# Patient Record
Sex: Female | Born: 1970 | Race: Black or African American | Hispanic: No | Marital: Married | Smoking: Current every day smoker
Health system: Southern US, Community
[De-identification: ages and names within clinical notes are randomized; demographics above are authoritative.]

## PROBLEM LIST (undated history)

## (undated) DIAGNOSIS — F319 Bipolar disorder, unspecified: Secondary | ICD-10-CM

## (undated) DIAGNOSIS — F329 Major depressive disorder, single episode, unspecified: Secondary | ICD-10-CM

## (undated) DIAGNOSIS — E7800 Pure hypercholesterolemia, unspecified: Secondary | ICD-10-CM

## (undated) DIAGNOSIS — F32A Depression, unspecified: Secondary | ICD-10-CM

## (undated) DIAGNOSIS — D735 Infarction of spleen: Secondary | ICD-10-CM

## (undated) DIAGNOSIS — I10 Essential (primary) hypertension: Secondary | ICD-10-CM

## (undated) HISTORY — PX: ABDOMINAL HYSTERECTOMY: SHX81

## (undated) HISTORY — PX: OTHER SURGICAL HISTORY: SHX169

## (undated) HISTORY — PX: TUBAL LIGATION: SHX77

## (undated) HISTORY — PX: PALATOPLASTY: SHX2153

---

## 1997-11-14 ENCOUNTER — Encounter: Admission: RE | Admit: 1997-11-14 | Discharge: 1997-11-14 | Payer: Self-pay | Admitting: *Deleted

## 2000-04-26 ENCOUNTER — Emergency Department (HOSPITAL_COMMUNITY): Admission: EM | Admit: 2000-04-26 | Discharge: 2000-04-26 | Payer: Self-pay | Admitting: Emergency Medicine

## 2001-01-04 ENCOUNTER — Encounter: Payer: Self-pay | Admitting: Emergency Medicine

## 2001-01-04 ENCOUNTER — Emergency Department (HOSPITAL_COMMUNITY): Admission: EM | Admit: 2001-01-04 | Discharge: 2001-01-04 | Payer: Self-pay

## 2001-01-28 ENCOUNTER — Emergency Department (HOSPITAL_COMMUNITY): Admission: EM | Admit: 2001-01-28 | Discharge: 2001-01-28 | Payer: Self-pay

## 2001-10-31 ENCOUNTER — Emergency Department (HOSPITAL_COMMUNITY): Admission: EM | Admit: 2001-10-31 | Discharge: 2001-10-31 | Payer: Self-pay | Admitting: Emergency Medicine

## 2002-06-16 ENCOUNTER — Ambulatory Visit (HOSPITAL_COMMUNITY): Admission: RE | Admit: 2002-06-16 | Discharge: 2002-06-16 | Payer: Self-pay | Admitting: Emergency Medicine

## 2002-08-25 ENCOUNTER — Emergency Department (HOSPITAL_COMMUNITY): Admission: EM | Admit: 2002-08-25 | Discharge: 2002-08-25 | Payer: Self-pay | Admitting: Emergency Medicine

## 2002-11-26 ENCOUNTER — Emergency Department (HOSPITAL_COMMUNITY): Admission: EM | Admit: 2002-11-26 | Discharge: 2002-11-26 | Payer: Self-pay | Admitting: Emergency Medicine

## 2002-11-26 ENCOUNTER — Encounter: Payer: Self-pay | Admitting: *Deleted

## 2002-12-11 ENCOUNTER — Emergency Department (HOSPITAL_COMMUNITY): Admission: AD | Admit: 2002-12-11 | Discharge: 2002-12-11 | Payer: Self-pay | Admitting: Emergency Medicine

## 2003-02-02 ENCOUNTER — Encounter: Payer: Self-pay | Admitting: Cardiovascular Disease

## 2003-02-02 ENCOUNTER — Ambulatory Visit (HOSPITAL_COMMUNITY): Admission: RE | Admit: 2003-02-02 | Discharge: 2003-02-02 | Payer: Self-pay | Admitting: Cardiovascular Disease

## 2003-05-02 ENCOUNTER — Emergency Department (HOSPITAL_COMMUNITY): Admission: EM | Admit: 2003-05-02 | Discharge: 2003-05-02 | Payer: Self-pay | Admitting: Emergency Medicine

## 2003-12-31 ENCOUNTER — Emergency Department (HOSPITAL_COMMUNITY): Admission: EM | Admit: 2003-12-31 | Discharge: 2003-12-31 | Payer: Self-pay | Admitting: Emergency Medicine

## 2004-12-03 ENCOUNTER — Other Ambulatory Visit: Admission: RE | Admit: 2004-12-03 | Discharge: 2004-12-03 | Payer: Self-pay | Admitting: Obstetrics and Gynecology

## 2005-03-11 ENCOUNTER — Observation Stay (HOSPITAL_COMMUNITY): Admission: RE | Admit: 2005-03-11 | Discharge: 2005-03-12 | Payer: Self-pay | Admitting: Obstetrics and Gynecology

## 2005-03-11 ENCOUNTER — Encounter (INDEPENDENT_AMBULATORY_CARE_PROVIDER_SITE_OTHER): Payer: Self-pay | Admitting: Specialist

## 2006-07-01 ENCOUNTER — Ambulatory Visit (HOSPITAL_COMMUNITY): Admission: RE | Admit: 2006-07-01 | Discharge: 2006-07-01 | Payer: Self-pay | Admitting: Obstetrics and Gynecology

## 2006-08-26 ENCOUNTER — Inpatient Hospital Stay (HOSPITAL_COMMUNITY): Admission: RE | Admit: 2006-08-26 | Discharge: 2006-08-27 | Payer: Self-pay | Admitting: Obstetrics and Gynecology

## 2006-08-26 ENCOUNTER — Encounter (INDEPENDENT_AMBULATORY_CARE_PROVIDER_SITE_OTHER): Payer: Self-pay | Admitting: Specialist

## 2006-10-24 ENCOUNTER — Emergency Department (HOSPITAL_COMMUNITY): Admission: EM | Admit: 2006-10-24 | Discharge: 2006-10-24 | Payer: Self-pay | Admitting: Emergency Medicine

## 2007-11-28 ENCOUNTER — Emergency Department (HOSPITAL_COMMUNITY): Admission: EM | Admit: 2007-11-28 | Discharge: 2007-11-28 | Payer: Self-pay | Admitting: Emergency Medicine

## 2009-05-06 ENCOUNTER — Ambulatory Visit: Payer: Self-pay | Admitting: Oncology

## 2009-05-14 LAB — CMP (CANCER CENTER ONLY)
AST: 30 U/L (ref 11–38)
Albumin: 4 g/dL (ref 3.3–5.5)
Alkaline Phosphatase: 70 U/L (ref 26–84)
BUN, Bld: 11 mg/dL (ref 7–22)
Calcium: 9.5 mg/dL (ref 8.0–10.3)
Chloride: 100 mEq/L (ref 98–108)
Glucose, Bld: 92 mg/dL (ref 73–118)
Potassium: 3.3 mEq/L (ref 3.3–4.7)
Sodium: 136 mEq/L (ref 128–145)
Total Protein: 7.8 g/dL (ref 6.4–8.1)

## 2009-05-14 LAB — CBC WITH DIFFERENTIAL (CANCER CENTER ONLY)
BASO#: 0.1 10*3/uL (ref 0.0–0.2)
BASO%: 0.8 % (ref 0.0–2.0)
EOS%: 5.9 % (ref 0.0–7.0)
Eosinophils Absolute: 0.4 10*3/uL (ref 0.0–0.5)
HCT: 36.6 % (ref 34.8–46.6)
HGB: 12.5 g/dL (ref 11.6–15.9)
LYMPH#: 3 10*3/uL (ref 0.9–3.3)
LYMPH%: 46.5 % (ref 14.0–48.0)
MCH: 36.6 pg — ABNORMAL HIGH (ref 26.0–34.0)
MCHC: 34.2 g/dL (ref 32.0–36.0)
MCV: 107 fL — ABNORMAL HIGH (ref 81–101)
MONO#: 0.4 10*3/uL (ref 0.1–0.9)
MONO%: 5.6 % (ref 0.0–13.0)
NEUT#: 2.7 10*3/uL (ref 1.5–6.5)
NEUT%: 41.2 % (ref 39.6–80.0)
Platelets: 321 10*3/uL (ref 145–400)
RBC: 3.41 10*6/uL — ABNORMAL LOW (ref 3.70–5.32)
RDW: 11 % (ref 10.5–14.6)
WBC: 6.5 10*3/uL (ref 3.9–10.0)

## 2009-05-18 LAB — PROTEIN ELECTROPHORESIS, SERUM
Alpha-2-Globulin: 9.1 % (ref 7.1–11.8)
Beta Globulin: 5 % (ref 4.7–7.2)
Gamma Globulin: 17.4 % (ref 11.1–18.8)
Total Protein, Serum Electrophoresis: 7.9 g/dL (ref 6.0–8.3)

## 2009-05-18 LAB — HYPERCOAGULABLE PANEL, COMPREHENSIVE
Beta-2 Glyco I IgG: <3 U/mL (ref <=15)
DRVVT 1:1 Mix: 42.3 secs (ref 36.1–47.0)
DRVVT: 46.3 secs — ABNORMAL HIGH (ref 36.2–46.0)
Homocysteine: 48.8 umol/L — ABNORMAL HIGH (ref 4.0–15.4)
PTT Lupus Anticoagulant: 42.5 secs (ref 32.0–43.4)
Protein C Activity: 181 % — ABNORMAL HIGH (ref 75–133)
Protein C, Total: 130 % (ref 70–140)
Protein S Activity: 151 % — ABNORMAL HIGH (ref 69–129)
Protein S Ag, Total: 151 % — ABNORMAL HIGH (ref 70–140)

## 2009-05-18 LAB — FOLATE: Folate: 5 ng/mL

## 2009-06-07 ENCOUNTER — Ambulatory Visit: Payer: Self-pay | Admitting: Oncology

## 2009-07-16 ENCOUNTER — Ambulatory Visit: Payer: Self-pay | Admitting: Oncology

## 2009-10-07 ENCOUNTER — Ambulatory Visit: Payer: Self-pay | Admitting: Oncology

## 2010-03-11 ENCOUNTER — Encounter
Admission: RE | Admit: 2010-03-11 | Discharge: 2010-04-15 | Payer: Self-pay | Source: Home / Self Care | Attending: Family Medicine | Admitting: Family Medicine

## 2010-04-15 ENCOUNTER — Encounter
Admission: RE | Admit: 2010-04-15 | Discharge: 2010-05-20 | Payer: Self-pay | Source: Home / Self Care | Attending: Family Medicine | Admitting: Family Medicine

## 2010-05-11 ENCOUNTER — Encounter: Payer: Self-pay | Admitting: Obstetrics and Gynecology

## 2010-09-05 NOTE — Op Note (Signed)
NAMEBELLA, BRUMMET             ACCOUNT NO.:  [id number]   MEDICAL RECORD NO.:  [id number]          PATIENT TYPE:  AMB   LOCATION:  SDC                           FACILITY:  [facility]   PHYSICIAN:  Miguel Aschoff, M.D.       DATE OF BIRTH:  02-01-1971   DATE OF PROCEDURE:  08/26/2006  DATE OF DISCHARGE:                               OPERATIVE REPORT   PREOPERATIVE DIAGNOSIS:  Menorrhagia, chronic pelvic pain.   POSTOPERATIVE DIAGNOSIS:  Menorrhagia, chronic pelvic pain, with pelvic  adhesions.   PROCEDURE:  Laparoscopically assisted vaginal hysterectomy and right  salpingo-oophorectomy, lysis of adhesions.   SURGEON:  Miguel Aschoff, M.D.   ASSISTANT:  Luvenia Redden, M.D.   ANESTHESIA:  General.   COMPLICATIONS:  None.   JUSTIFICATION:  The patient is a 39 year old black female with history  of persistent chronic pelvic pain, previous history of pelvic adhesions,  previous history of left salpingo-oophorectomy because of pelvic pain  and pelvic adhesions. Her symptoms have now returned.  In addition to  the pelvic pain, she is having menorrhagia.  The options of therapy were  discussed with the patient and, at this point, the patient wanted a  definitive procedure carried out for resolution of her pelvic pain and  she presents now to undergo laparoscopically assisted vaginal  hysterectomy and right salpingo-oophorectomy.  The risks and benefits of  the procedure were discussed with the patient.  She understands that  this will result in her inability to have children and, in addition to  that, it will necessitate her requiring replacement therapy. With this,  information informed consent has been obtained.   DESCRIPTION OF PROCEDURE:  The patient was taken to the operating room  and placed in the supine position.  General anesthesia was administered  without difficulty.  She was then placed in dorsal lithotomy position,  prepped and draped in the usual sterile fashion.  The bladder  was  catheterized.  Once this was done, a Hulka tenaculum was placed through  the cervix and held.  Attention was then directed to the umbilicus where  a small infraumbilical incision was made.  A Veress needle was inserted  and then the abdomen was insufflated with 3 liters of CO2.  Following  the insufflation, the trocar to the laparoscope was placed followed by  laparoscope itself.  After this was done, systematic inspection was  carried out.  There were adhesions holding the omentum to the right  anterior abdominal wall.  The adhesions previously noted on her  laparoscopy in 2006 had not reformed.  The uterus appeared to be normal  size and shape.  The right ovary appeared to be within normal limits.  There was no definite evidence of endometriosis.   At this point, under direct vision visualization, accessory ports were  established in the left lower quadrant and right lower quadrant.  Through these 5 mm ports it was possible to place the gyrus unit. On the  left side, the round ligament was identified, cauterized and cut.  The  structures of the broad ligament were grasped, cauterized and cut until  the level of the uterine artery.  Attention was then directed to the  right side where the infundibulopelvic ligament was identified, grasped  and elevated, and with the ureter being out of the way, the  infundibulopelvic ligament was cauterized and cut and then the  dissection was carried medially towards the uterus by cauterizing the  mesosalpinx and meso-ovarian ligaments until the uterus was reached.  Then, the round ligaments were identified, cauterized and cut, and  again, the broad ligament structures were cauterized and cut to the  level of the uterine arteries.   At this point, attention was directed to the perineum.  The patient was  placed in the high lithotomy position.  A weighted speculum was placed  in the vaginal vault.  The anterior cervical lip was then grasped with  a  tenaculum and injected with 10 mL 1% Xylocaine with epinephrine.  At  this point, the cervical mucosa was circumscribed and dissected  anteriorly and posteriorly until the peritoneal reflections were  identified.  The peritoneum was entered posteriorly and then the  uterosacral ligaments were identified, clamped with curved Heaney  clamps, pedicles were cut and suture ligated using suture ligatures of 0  Vicryl.  The cardinal ligaments were then clamped, cut, and suture  ligated in a similar fashion. Then, the paracervical fascia was  identified, clamped, cut and suture ligated using suture ligatures of 0  Vicryl.  This continued until the uterine vessels were found, clamped,  cut and suture ligated bilaterally.  It was then possible to deliver the  fundus of the uterus to the cul-de-sac.  The residual structures of the  broad ligament were then clamped, cut, and this freed the specimen, the  specimen consisting of the cervix, uterus, right tube and ovary.  These  were all sent for histologic study.  These pedicles were then tied with  ligatures of the 0 Vicryl.   At this point, the posterior vaginal cuff was run using running  interlocking 0 Vicryl suture.  The uterosacral ligaments were then  reapproximated in the midline using interrupted 0 Vicryl sutures and, at  this point, the cuff was closed using running interlocking 0 Vicryl  suture.  An Iodoform pack was placed in the vagina and, at this point,  the patient was placed in the modified lithotomy position.  Attention  was then directed to the laparoscopy ports.  The abdomen was  reinsufflated and inspection of the pelvis for hemostasis revealed  excellent hemostasis. At this point, the CO2 was allowed to escape.  The  trocars were removed and the small incisions were closed using  subcuticular 3-0 Vicryl. Band-Aids were applied.  At this point, the  patient was reversed from the anesthetic and taken to the recovery room in  satisfactory condition.  The estimated blood loss was approximately  100 mL.  The patient tolerated the procedure well.      Miguel Aschoff, M.D.  Electronically Signed     AR/MEDQ  D:  08/26/2006  T:  08/26/2006  Job:  [id number]

## 2010-09-05 NOTE — Discharge Summary (Signed)
Miranda Bernard, Miranda Bernard             ACCOUNT NO.:  [id number]   MEDICAL RECORD NO.:  [id number]          PATIENT TYPE:  INP   LOCATION:  9315                          FACILITY:  [facility]   PHYSICIAN:  Miguel Aschoff, M.D.       DATE OF BIRTH:  Nov 09, 1970   DATE OF ADMISSION:  08/26/2006  DATE OF DISCHARGE:  08/27/2006                               DISCHARGE SUMMARY   ADMISSION DIAGNOSES:  1. Chronic pelvic pain.  2. Menorrhagia.   FINAL DIAGNOSIS:  1. Adenomyosis.  2. Focal endometriosis.  3. Pelvic and abdominal adhesions.   OPERATIONS AND PROCEDURES:  Laparoscopically-assisted total vaginal  hysterectomy and right salpingo-oophorectomy.   BRIEF HISTORY:  The patient is a 40 year old black female who has had a  history of progressively worsening menorrhagia associated with pelvic  pain.  Attempts had been made to control the patient's bleeding and pain  using conservative measures.  However, these did not lead to any  improvement.  The patient did have a history of pelvic adhesions and did  undergo a laparoscopic left salpingo-oophorectomy in 2006, but because  of renewed pain and again heavy bleeding, she has requested that a  definitive procedure be carried out in an effort to resolve this  problem.  She was informed of the risks and limitations of this  procedure.  She understood the hysterectomy would result in her  inability to have children and that bilateral oophorectomy would require  hormone replacement therapy, but with this information informed consent  was obtained.   HOSPITAL COURSE:  Preoperative studies were obtained and on Aug 26, 2006,  under general anesthesia, laparoscopically-assisted total vaginal  hysterectomy and right salpingo-oophorectomy were carried out without  difficulty.  The patient's postoperative course was essentially  uncomplicated.  She did tolerate increasing ambulation and diet well and  by the afternoon of the first postoperative day, the patient  was feeling  well enough to be discharged home.  Medications for home included Tylox  one every three hours as needed for pain.  The patient was instructed to  resume all her other medications taken prior to admission to the  hospital she was instructed to do no heavy lifting, place nothing in  vagina and to call for any problems such as fever, pain or heavy  bleeding.  The final pathology report on the hysterectomy specimen did  reveal the presence of the adenomyosis and focal endometriosis.  Inspection of the adnexa revealed fibrous adhesions, benign follicular  cyst.      Miguel Aschoff, M.D.  Electronically Signed     AR/MEDQ  D:  08/31/2006  T:  08/31/2006  Job:  [id number]

## 2011-02-03 LAB — URINALYSIS, ROUTINE W REFLEX MICROSCOPIC
Bilirubin Urine: NEGATIVE
Hgb urine dipstick: NEGATIVE
Ketones, ur: NEGATIVE
Nitrite: POSITIVE — AB
Protein, ur: NEGATIVE
Urobilinogen, UA: 1

## 2011-02-03 LAB — URINE CULTURE: Colony Count: >=100000

## 2011-02-03 LAB — URINE MICROSCOPIC-ADD ON

## 2014-04-06 ENCOUNTER — Encounter (HOSPITAL_COMMUNITY): Payer: Self-pay | Admitting: Emergency Medicine

## 2014-04-06 ENCOUNTER — Emergency Department (HOSPITAL_COMMUNITY)
Admission: EM | Admit: 2014-04-06 | Discharge: 2014-04-06 | Disposition: A | Discharge status: Home / Self Care | Payer: BC Managed Care – PPO | Attending: Emergency Medicine | Admitting: Emergency Medicine

## 2014-04-06 DIAGNOSIS — Z720 Tobacco use: Secondary | ICD-10-CM | POA: Insufficient documentation

## 2014-04-06 DIAGNOSIS — K029 Dental caries, unspecified: Secondary | ICD-10-CM | POA: Insufficient documentation

## 2014-04-06 DIAGNOSIS — Z8659 Personal history of other mental and behavioral disorders: Secondary | ICD-10-CM | POA: Diagnosis not present

## 2014-04-06 DIAGNOSIS — Z8669 Personal history of other diseases of the nervous system and sense organs: Secondary | ICD-10-CM | POA: Diagnosis not present

## 2014-04-06 DIAGNOSIS — K088 Other specified disorders of teeth and supporting structures: Secondary | ICD-10-CM | POA: Diagnosis not present

## 2014-04-06 DIAGNOSIS — I10 Essential (primary) hypertension: Secondary | ICD-10-CM | POA: Insufficient documentation

## 2014-04-06 HISTORY — DX: Major depressive disorder, single episode, unspecified: F32.9

## 2014-04-06 HISTORY — DX: Depression, unspecified: F32.A

## 2014-04-06 HISTORY — DX: Bipolar disorder, unspecified: F31.9

## 2014-04-06 HISTORY — DX: Essential (primary) hypertension: I10

## 2014-04-06 MED ORDER — HYDROCODONE-ACETAMINOPHEN 5-325 MG PO TABS
2.0000 | ORAL_TABLET | Freq: Once | ORAL | Status: AC
  Administered 2014-04-06: 2 via ORAL
  Filled 2014-04-06: qty 2

## 2014-04-06 MED ORDER — PENICILLIN V POTASSIUM 500 MG PO TABS: 500.0000 mg | ORAL_TABLET | Freq: Three times a day (TID) | ORAL | Status: DC

## 2014-04-06 MED ORDER — HYDROCODONE-ACETAMINOPHEN 5-325 MG PO TABS: 1.0000 | ORAL_TABLET | Freq: Four times a day (QID) | ORAL | Status: DC | PRN

## 2014-04-06 NOTE — ED Notes (Signed)
Pt. reports left upper molar pain for 1 week .  

## 2014-04-06 NOTE — Discharge Instructions (Signed)
1. Medications: vicodin, penicillin, usual home medications 2. Treatment: rest, drink plenty of fluids, take medications as prescribed 3. Follow Up: Please followup with dentistry within 1 week for discussion of your diagnoses and further evaluation after today's visit; if you do not have a primary care doctor use the resource guide provided to find one; Return to the ER for high fevers, difficulty breathing, difficulty swallowing or other concerning symptoms    Dental Pain A tooth ache may be caused by cavities (tooth decay). Cavities expose the nerve of the tooth to air and hot or cold temperatures. It may come from an infection or abscess (also called a boil or furuncle) around your tooth. It is also often caused by dental caries (tooth decay). This causes the pain you are having. DIAGNOSIS  Your caregiver can diagnose this problem by exam. TREATMENT   If caused by an infection, it may be treated with medications which kill germs (antibiotics) and pain medications as prescribed by your caregiver. Take medications as directed.  Only take over-the-counter or prescription medicines for pain, discomfort, or fever as directed by your caregiver.  Whether the tooth ache today is caused by infection or dental disease, you should see your dentist as soon as possible for further care. SEEK MEDICAL CARE IF: The exam and treatment you received today has been provided on an emergency basis only. This is not a substitute for complete medical or dental care. If your problem worsens or new problems (symptoms) appear, and you are unable to meet with your dentist, call or return to this location. SEEK IMMEDIATE MEDICAL CARE IF:   You have a fever.  You develop redness and swelling of your face, jaw, or neck.  You are unable to open your mouth.  You have severe pain uncontrolled by pain medicine. MAKE SURE YOU:   Understand these instructions.  Will watch your condition.  Will get help right away  if you are not doing well or get worse. Document Released: 04/06/2005 Document Revised: 06/29/2011 Document Reviewed: 11/23/2007 [facility] Patient Information 2015 [place], [place]. This information is not intended to replace advice given to you by your health care provider. Make sure you discuss any questions you have with your health care provider.    Emergency Department Resource Guide 1) Find a Doctor and Pay Out of Pocket Although you won't have to find out who is covered by your insurance plan, it is a good idea to ask around and get recommendations. You will then need to call the office and see if the doctor you have chosen will accept you as a new patient and what types of options they offer for patients who are self-pay. Some doctors offer discounts or will set up payment plans for their patients who do not have insurance, but you will need to ask so you aren't surprised when you get to your appointment.  2) Contact Your Local Health Department Not all health departments have doctors that can see patients for sick visits, but many do, so it is worth a call to see if yours does. If you don't know where your local health department is, you can check in your phone book. The CDC also has a tool to help you locate your state's health department, and many state websites also have listings of all of their local health departments.  3) Find a Walk-in Clinic If your illness is not likely to be very severe or complicated, you may want to try a walk in clinic. These  are popping up all over the country in pharmacies, drugstores, and shopping centers. They're usually staffed by nurse practitioners or physician assistants that have been trained to treat common illnesses and complaints. They're usually fairly quick and inexpensive. However, if you have serious medical issues or chronic medical problems, these are probably not your best option.  No Primary Care Doctor: - Call Health Connect at  [phone] -  they can help you locate a primary care doctor that  accepts your insurance, provides certain services, etc. - Physician Referral Service- [phone]  Chronic Pain Problems: Organization         Address  Phone   Notes  [facility]  [phone] Patients need to be referred by their primary care doctor.   Medication Assistance: Organization         Address  Phone   Notes  [facility] Medication [facility] [address] [place] [place], [place] [address] [phone] --Must be a resident of [facility] -- Must have NO insurance coverage whatsoever (no Medicaid/ Medicare, etc.) -- The pt. MUST have a primary care doctor that directs their care regularly and follows them in the community   [facility]ist  [phone]   [employer]  [phone]    Agencies that provide inexpensive medical care: Organization         Address  Phone   Notes  [facility]  [phone]   [facility]    [phone]   [facility] [address] [place], [place] [address] [phone]   [facility] [address] [place] [phone]   Planned Parenthood    [phone]   [facility]    [phone]   [facility]  [address], [place] Phone:  [phone], Fax:  [phone] Hours of Operation:  9 am - 6 pm, M-F.  Also accepts Medicaid/Medicare and self-pay.  [facility]  [address], [place] Phone: [phone], Fax: [phone]. Hours of Operation:  8:30 am - 5:30 pm, M-F.  Also accepts Medicaid and self-pay.  [facility] [place] [address] [place] Phone: [phone]   [facility] [address] Natasha Bence [place], [place] [phone], Ext. [phone] Mondays & Thursdays: 7-9 AM.  First 15 patients are seen on a first come, first serve basis.    Medicaid-accepting  [facility] Providers:  Organization         Address  Phone   Notes  [facility] [address], [place] [phone] Also accepts self-pay patients.  [facility] [address] Laurell Josephs [place], [place]  [phone]   [facility] [address], [place] [phone]   [facility] [address] [place] [phone]   Renaye Rakers [address], [place]   [phone] Only accepts [place] patients after they have their name applied to their card.   Self-Pay (no insurance) in [facility]:  Organization         Address  Phone   Notes  Sickle Cell Patients, [facility] [address] [place], [place] [phone]   [facility] [address] [place], [place] [phone]   [facility]  [address], [place] [phone]   [facility]/Dr. Osei-Bonsu  [address] [place] [address], [place] or [address], [place] [phone] Phone number for both [place] and [place] locations is the same.  [facility] [address], [place] [phone]   [facility] [address] [place] [address], [place] or [address] [phone]   [facility] [address], [place] [phone]) [phone], phone; [phone], fax Sees patients 1st and 3rd Saturday of every month.  Must not qualify for public or private insurance (i.e. Medicaid, Medicare, [place] Health Choice, Veterans' Benefits)  Household income should be no more than 200% of the poverty level The clinic cannot treat you if you are pregnant or think you are pregnant  Sexually transmitted diseases are not treated at the clinic.    Dental Care: Organization         Address  Phone  Notes  [facility]  [facility] [address], [place] [phone] Accepts children up to age 44 who are enrolled in [place]icaid or [place] Health Choice; pregnant women with a Medicaid card; and children who have applied for Medicaid or [place] Health Choice, but were declined, whose parents can pay a reduced fee at time of service.  [facility] [place]  [address], [place] [phone] Accepts children up to age 44 who are enrolled in [place] or [place] Health Choice; pregnant women with a Medicaid card; and children who have applied for Medicaid or [place] Health Choice, but were declined, whose parents can pay a reduced fee at time of service.  [facility]  [address], [place] [phone] Patients are seen by appointment only. Walk-ins are not accepted. [facility] will see patients 44 years of age and older. Monday - Tuesday (8am-5pm) Most Wednesdays (8:30-5pm) $30 per visit, cash only  [facility]  [address], [facility] [phone] Patients are seen by appointment only. Walk-ins are not accepted. [facility] will see patients 44 years of age and older. One Wednesday Evening (Monthly: Volunteer Based).  $30 per visit, cash only  [employer] [facility] [facility]  [phone] for adults; Children under age 494, call Graduate Pediatric Dentistry at [phone]. Children aged 354-14, please call [phone] to request a pediatric application.  Dental services are provided in all areas of dental care including fillings, crowns and bridges, complete and partial dentures, implants, gum treatment, root canals, and extractions. Preventive care is also provided. Treatment is provided to both adults and children. Patients are selected via a lottery and there is often a waiting list.   [facility] [address], [place]  [phone] [email or web address]   [facility] [address], [place], [place] [phone], Ext. [phone] Second and Fourth Thursday of each month, opens at 6:30 AM; Clinic ends at 9 AM.  Patients are seen on a first-come first-served basis, and a limited number are seen during each clinic.   [facility]  [address], [place], [place] [phone]) [phone]   Eligibility Requirements You must have lived in [place], [place], or [place] counties for at least the last three months.   You cannot be eligible for state or federal sponsored [place] Cityhealthcare insurance, including CIGNAVeterans Administration, [place]icaid,  or Medicare.   You generally cannot be eligible for healthcare insurance through your employer.    How to apply: Eligibility screenings are held every Tuesday and Wednesday afternoon from 1:00 pm until 4:00 pm. You do not need an appointment for the interview!  [facility] [address], [place], [place] [phone]   [facility]  [phone]   [facility]  [phone]   [facility]  [phone]    Behavioral Health Resources in the Community: Intensive Outpatient Programs Organization         Address  Phone  Notes  [facility] [address], [place], [place] [phone]   [facility] [address], [place], [place] [phone]   [facility] [address], [place], [place]  [phone]   [facility] [address],  [place], [place] [phone] or [phone]   Substance Abuse Resources Organization         Address  Phone  Notes  [facility]  [phone]   [facility]  [phone]   [facility]  [phone]   [facility]  [phone]   Residential & Outpatient Substance Abuse Program  [phone]   Psychological Services Organization         Address  Phone  Notes  [facility]   [phone]   [facility]  [phone]- [phone]   [facility] [address], [place] [phone] or [phone]    Mobile Crisis Teams Organization         Address  Phone  Notes  [facility], Mobile Crisis Care Unit  [phone]   [facility]  [address] [place], [place] [phone]   Doristine LocksSharon DeEsch [address] [place] [phone]    Self-Help/Support Groups Organization         Address  Phone             Notes  [facility] [place] - variety of support groups  [phone] Call for more information  Narcotics Anonymous (NA), [facility]aring [facility] [address], [place]  2 meetings at this location   Statisticianesidential Treatment Programs Organization         Address  Phone  Notes  [facility] [address],    [place]  [phone]   [facility]  [address], [place] [phone], [place], [place] [phone]   [facility] [address], [place] [phone] Admissions: 8am-3pm M-F  [facility] [address]    [place], [place] [phone]   [facility] [address], [place], [place] [phone]   [facility] [address]  [place], [place] [phone]   [facility] - Intensive Outpatient [address], [place], [place] [phone]   [facility] ([facility] [address]  [place], [place] [phone] or [phone]   [facility] ([facility]) [address] [place], [place] [phone] Accepts Medicaid  [facility] [address]  [place] [phone] Substance Abuse/Addiction Treatment   [facility] Resources Organization         Address  Phone  Notes  [facility]  [phone]   Angie FavaJulie Brannon, PhD [address] Ske[address] [place], [place]   [phone] or  [phone]) [phone]   [facility]oses  [place], [place] [phone]   [facility] [address], [place], [place] [phone] Insurance/Medicaid/sponsorship through [facility] and Families [address] [place] [address], [address] [place], [place] [phone] [place], [place] [phone]    Dr. Adele Schilder  [phone]   [facility]. 1) [address], [place] 2) [place] 3)  [place] [address], [place] [phone]  [phone]   [place] [phone] or [phone] (After Hours)

## 2014-04-06 NOTE — ED Provider Notes (Signed)
CSN: [id number]     Arrival date & time 04/06/14  2208 History  This chart was scribed for non-physician practitioner, Dierdre ForthHannah Aladdin Kollmann, PA-C, working with Suzi RootsKevin E Steinl, MD, by Bronson CurbJacqueline Melvin, ED Scribe. This patient was seen in room TR06C/TR06C and the patient's care was started at 10:26 PM.   Chief Complaint  Patient presents with  . Dental Pain    The history is provided by the patient. No language interpreter was used.     HPI Comments: Miranda Bernard is a 43 y.o. female, with history of HTN, who presents to the Emergency Department complaining of left upper dental pain for the past week, but has gotten worse tonight. Patient states she grinds her teeth a night suspects this to be the cause of her dental pain. There is associated subjective gingival swelling. Patient has tried ibuprofen, Aleve, warm salt water rinses, peroxide rinses, and a gel anesthetic without significant improvement. She denies fever, chills, nuasea, or vomiting. Patient states she was recently diagnosed with "severe" sleep apnea and depression". NKA to penicillins. Patient is not established with a local dentist.    Past Medical History  Diagnosis Date  . Hypertension   . Depression   . Bipolar 1 disorder    Past Surgical History  Procedure Laterality Date  . Chieloplasty    . Palatoplasty     No family history on file. History  Substance Use Topics  . Smoking status: Current Every Day Smoker  . Smokeless tobacco: Not on file  . Alcohol Use: Yes   OB History    No data available     Review of Systems  Constitutional: Negative for fever, chills and appetite change.  HENT: Positive for dental problem. Negative for drooling, ear pain, facial swelling, nosebleeds, postnasal drip, rhinorrhea and trouble swallowing.   Eyes: Negative for pain and redness.  Respiratory: Negative for cough and wheezing.   Cardiovascular: Negative for chest pain.  Gastrointestinal: Negative for nausea, vomiting  and abdominal pain.  Musculoskeletal: Negative for neck pain and neck stiffness.  Skin: Negative for color change and rash.  Neurological: Negative for weakness, light-headedness and headaches.  All other systems reviewed and are negative.     Allergies  Review of patient's allergies indicates no known allergies.  Home Medications   Prior to Admission medications   Medication Sig Start Date End Date Taking? Authorizing Provider  HYDROcodone-acetaminophen (NORCO/VICODIN) 5-325 MG per tablet Take 1-2 tablets by mouth every 6 (six) hours as needed for moderate pain or severe pain. 04/06/14   Elo Marmolejos, PA-C  penicillin v potassium (VEETID) 500 MG tablet Take 1 tablet (500 mg total) by mouth 3 (three) times daily. 04/06/14   Evett Kassa, PA-C   Triage Vitals: BP 196/90 mmHg  Pulse 88  Temp(Src) 98.2 F (36.8 C)  Resp 16  SpO2 99%  Physical Exam  Constitutional: She appears well-developed and well-nourished.  HENT:  Head: Normocephalic.  Right Ear: Tympanic membrane, external ear and ear canal normal.  Left Ear: Tympanic membrane, external ear and ear canal normal.  Nose: Nose normal. Right sinus exhibits no maxillary sinus tenderness and no frontal sinus tenderness. Left sinus exhibits no maxillary sinus tenderness and no frontal sinus tenderness.  Mouth/Throat: Uvula is midline, oropharynx is clear and moist and mucous membranes are normal. No oral lesions. Abnormal dentition. Dental caries present. No uvula swelling or lacerations. No oropharyngeal exudate, posterior oropharyngeal edema, posterior oropharyngeal erythema or tonsillar abscesses.  No gingival swelling, fluctuance or induration No  gross abscess Tooth #14 with large cavity and TTP  Eyes: Conjunctivae are normal. Pupils are equal, round, and reactive to light. Right eye exhibits no discharge. Left eye exhibits no discharge.  Neck: Normal range of motion. Neck supple.  No stridor Handling secretions  without difficulty No nuchal rigidity No cervical lymphadenopathy   Cardiovascular: Normal rate, regular rhythm and normal heart sounds.   Pulmonary/Chest: Effort normal. No respiratory distress.  Equal chest rise  Abdominal: Soft. Bowel sounds are normal. She exhibits no distension. There is no tenderness.  Lymphadenopathy:    She has no cervical adenopathy.  Neurological: She is alert.  Skin: Skin is warm and dry.  Psychiatric: She has a normal mood and affect.  Nursing note and vitals reviewed.   ED Course  Procedures (including critical care time)  DIAGNOSTIC STUDIES: Oxygen Saturation is 99% on room air, normal by my interpretation.    COORDINATION OF CARE: At 2230 Discussed treatment plan with patient which includes pain medication. Patient agrees.   Labs Review Labs Reviewed - No data to display  Imaging Review No results found.   EKG Interpretation None      MDM   Final diagnoses:  Pain due to dental caries  Essential hypertension   Miranda Bernard presents with dental pain.  No gross abscess.  Exam unconcerning for Ludwig's angina or spread of infection.  Will treat with penicillin and pain medicine.  Urged patient to follow-up with dentist.    I have personally reviewed patient's vitals, nursing note and any pertinent labs or imaging.  I performed an focused physical exam; undressed when appropriate .    It has been determined that no acute conditions requiring further emergency intervention are present at this time. The patient/guardian have been advised of the diagnosis and plan. I reviewed any labs and imaging including any potential incidental findings. We have discussed signs and symptoms that warrant return to the ED and they are listed in the discharge instructions.    Vital signs are stable at discharge. Patient noted to be hypertensive in the emergency department.  No signs of hypertensive urgency.  Discussed with patient the need for close  follow-up and management by their primary care physician.   Pt's HTN is likely exacerbated by her pain.  BP 196/90 mmHg  Pulse 88  Temp(Src) 98.2 F (36.8 C)  Resp 16  SpO2 99%         Dierdre ForthHannah Ivry Pigue, PA-C 04/06/14 2250  Suzi RootsKevin E Steinl, MD 04/09/14 [phone]

## 2014-04-06 NOTE — ED Notes (Signed)
The    Pt has a severe toothache for one week  No temp.

## 2014-04-29 ENCOUNTER — Encounter (HOSPITAL_COMMUNITY): Payer: Self-pay | Admitting: Emergency Medicine

## 2014-04-29 DIAGNOSIS — F17210 Nicotine dependence, cigarettes, uncomplicated: Secondary | ICD-10-CM | POA: Diagnosis present

## 2014-04-29 DIAGNOSIS — D735 Infarction of spleen: Principal | ICD-10-CM | POA: Diagnosis present

## 2014-04-29 DIAGNOSIS — F319 Bipolar disorder, unspecified: Secondary | ICD-10-CM | POA: Diagnosis present

## 2014-04-29 DIAGNOSIS — R1012 Left upper quadrant pain: Secondary | ICD-10-CM | POA: Diagnosis not present

## 2014-04-29 DIAGNOSIS — E785 Hyperlipidemia, unspecified: Secondary | ICD-10-CM | POA: Diagnosis present

## 2014-04-29 DIAGNOSIS — Z7982 Long term (current) use of aspirin: Secondary | ICD-10-CM

## 2014-04-29 DIAGNOSIS — E780 Pure hypercholesterolemia: Secondary | ICD-10-CM | POA: Diagnosis present

## 2014-04-29 DIAGNOSIS — I10 Essential (primary) hypertension: Secondary | ICD-10-CM | POA: Diagnosis present

## 2014-04-29 LAB — I-STAT TROPONIN, ED: TROPONIN I, POC: 0 ng/mL (ref 0.00–0.08)

## 2014-04-29 NOTE — ED Notes (Signed)
Woke up around 0900 yesterday noticed left side of body numb with pain in left chest.  Also  C/o SOB and states I haven't been able to eat because of the pain.  Took 2 aleve 1100 this am with no decrease noted in pain.

## 2014-04-30 ENCOUNTER — Emergency Department (HOSPITAL_COMMUNITY): Payer: BLUE CROSS/BLUE SHIELD

## 2014-04-30 ENCOUNTER — Inpatient Hospital Stay (HOSPITAL_COMMUNITY): Payer: BLUE CROSS/BLUE SHIELD

## 2014-04-30 ENCOUNTER — Inpatient Hospital Stay (HOSPITAL_COMMUNITY)
Admission: EM | Admit: 2014-04-30 | Discharge: 2014-05-01 | DRG: 816 | Disposition: A | Discharge status: Home / Self Care | Payer: BLUE CROSS/BLUE SHIELD | Attending: Internal Medicine | Admitting: Internal Medicine

## 2014-04-30 ENCOUNTER — Encounter (HOSPITAL_COMMUNITY): Payer: Self-pay | Admitting: Radiology

## 2014-04-30 DIAGNOSIS — F319 Bipolar disorder, unspecified: Secondary | ICD-10-CM | POA: Diagnosis present

## 2014-04-30 DIAGNOSIS — R1084 Generalized abdominal pain: Secondary | ICD-10-CM

## 2014-04-30 DIAGNOSIS — I10 Essential (primary) hypertension: Secondary | ICD-10-CM | POA: Diagnosis present

## 2014-04-30 DIAGNOSIS — I369 Nonrheumatic tricuspid valve disorder, unspecified: Secondary | ICD-10-CM

## 2014-04-30 DIAGNOSIS — D735 Infarction of spleen: Principal | ICD-10-CM | POA: Diagnosis present

## 2014-04-30 DIAGNOSIS — Z7982 Long term (current) use of aspirin: Secondary | ICD-10-CM | POA: Diagnosis not present

## 2014-04-30 DIAGNOSIS — E785 Hyperlipidemia, unspecified: Secondary | ICD-10-CM | POA: Diagnosis present

## 2014-04-30 DIAGNOSIS — F17210 Nicotine dependence, cigarettes, uncomplicated: Secondary | ICD-10-CM | POA: Diagnosis present

## 2014-04-30 DIAGNOSIS — R200 Anesthesia of skin: Secondary | ICD-10-CM

## 2014-04-30 DIAGNOSIS — R1012 Left upper quadrant pain: Secondary | ICD-10-CM | POA: Diagnosis present

## 2014-04-30 DIAGNOSIS — R079 Chest pain, unspecified: Secondary | ICD-10-CM

## 2014-04-30 DIAGNOSIS — R109 Unspecified abdominal pain: Secondary | ICD-10-CM | POA: Diagnosis present

## 2014-04-30 DIAGNOSIS — E780 Pure hypercholesterolemia: Secondary | ICD-10-CM | POA: Diagnosis present

## 2014-04-30 HISTORY — DX: Pure hypercholesterolemia, unspecified: E78.00

## 2014-04-30 LAB — BASIC METABOLIC PANEL
Anion gap: 16 — ABNORMAL HIGH (ref 5–15)
BUN: 7 mg/dL (ref 6–23)
CALCIUM: 9.6 mg/dL (ref 8.4–10.5)
CO2: 20 mmol/L (ref 19–32)
CREATININE: 0.95 mg/dL (ref 0.50–1.10)
Chloride: 105 mEq/L (ref 96–112)
GFR, EST AFRICAN AMERICAN: 83 mL/min — AB (ref >90)
GFR, EST NON AFRICAN AMERICAN: 72 mL/min — AB (ref >90)
Glucose, Bld: 129 mg/dL — ABNORMAL HIGH (ref 70–99)
Potassium: 3.5 mmol/L (ref 3.5–5.1)
SODIUM: 141 mmol/L (ref 135–145)

## 2014-04-30 LAB — CBC WITH DIFFERENTIAL/PLATELET
Basophils Absolute: 0.1 10*3/uL (ref 0.0–0.1)
Basophils Relative: 1 % (ref 0–1)
Eosinophils Absolute: 0.3 10*3/uL (ref 0.0–0.7)
Eosinophils Relative: 4 % (ref 0–5)
HEMATOCRIT: 35.4 % — AB (ref 36.0–46.0)
Hemoglobin: 11.7 g/dL — ABNORMAL LOW (ref 12.0–15.0)
LYMPHS PCT: 38 % (ref 12–46)
Lymphs Abs: 3.7 10*3/uL (ref 0.7–4.0)
MCH: 35.6 pg — AB (ref 26.0–34.0)
MCHC: 33.1 g/dL (ref 30.0–36.0)
MCV: 107.6 fL — ABNORMAL HIGH (ref 78.0–100.0)
MONO ABS: 0.7 10*3/uL (ref 0.1–1.0)
MONOS PCT: 7 % (ref 3–12)
NEUTROS ABS: 5 10*3/uL (ref 1.7–7.7)
NEUTROS PCT: 50 % (ref 43–77)
Platelets: 205 10*3/uL (ref 150–400)
RBC: 3.29 MIL/uL — ABNORMAL LOW (ref 3.87–5.11)
RDW: 13.1 % (ref 11.5–15.5)
WBC: 9.8 10*3/uL (ref 4.0–10.5)

## 2014-04-30 LAB — COMPREHENSIVE METABOLIC PANEL
ALT: 14 U/L (ref 0–35)
AST: 20 U/L (ref 0–37)
Albumin: 3.5 g/dL (ref 3.5–5.2)
Alkaline Phosphatase: 64 U/L (ref 39–117)
Anion gap: 8 (ref 5–15)
BUN: 8 mg/dL (ref 6–23)
CO2: 22 mmol/L (ref 19–32)
CREATININE: 0.78 mg/dL (ref 0.50–1.10)
Calcium: 8.5 mg/dL (ref 8.4–10.5)
Chloride: 107 mEq/L (ref 96–112)
GFR calc Af Amer: >90 mL/min (ref >90)
GFR calc non Af Amer: >90 mL/min (ref >90)
GLUCOSE: 104 mg/dL — AB (ref 70–99)
Potassium: 3.4 mmol/L — ABNORMAL LOW (ref 3.5–5.1)
Sodium: 137 mmol/L (ref 135–145)
Total Bilirubin: 0.6 mg/dL (ref 0.3–1.2)
Total Protein: 6.3 g/dL (ref 6.0–8.3)

## 2014-04-30 LAB — CBC
HEMATOCRIT: 36.2 % (ref 36.0–46.0)
Hemoglobin: 12.1 g/dL (ref 12.0–15.0)
MCH: 35.9 pg — AB (ref 26.0–34.0)
MCHC: 33.4 g/dL (ref 30.0–36.0)
MCV: 107.4 fL — ABNORMAL HIGH (ref 78.0–100.0)
Platelets: 251 10*3/uL (ref 150–400)
RBC: 3.37 MIL/uL — AB (ref 3.87–5.11)
RDW: 13.1 % (ref 11.5–15.5)
WBC: 12 10*3/uL — AB (ref 4.0–10.5)

## 2014-04-30 LAB — HEPATIC FUNCTION PANEL
ALBUMIN: 4 g/dL (ref 3.5–5.2)
ALT: 15 U/L (ref 0–35)
AST: 30 U/L (ref 0–37)
Alkaline Phosphatase: 69 U/L (ref 39–117)
BILIRUBIN TOTAL: 0.6 mg/dL (ref 0.3–1.2)
Total Protein: 6.9 g/dL (ref 6.0–8.3)

## 2014-04-30 LAB — LIPASE, BLOOD: LIPASE: 52 U/L (ref 11–59)

## 2014-04-30 LAB — LACTATE DEHYDROGENASE: LDH: 209 U/L (ref 94–250)

## 2014-04-30 LAB — TROPONIN I

## 2014-04-30 LAB — LITHIUM LEVEL: Lithium Lvl: 0.07 mmol/L — ABNORMAL LOW (ref 0.80–1.40)

## 2014-04-30 MED ORDER — PENICILLIN V POTASSIUM 500 MG PO TABS: 500.0000 mg | ORAL_TABLET | Freq: Three times a day (TID) | ORAL | Status: DC

## 2014-04-30 MED ORDER — IOHEXOL 300 MG/ML  SOLN
100.0000 mL | Freq: Once | INTRAMUSCULAR | Status: AC | PRN
  Administered 2014-04-30: 100 mL via INTRAVENOUS

## 2014-04-30 MED ORDER — LISINOPRIL 5 MG PO TABS
5.0000 mg | ORAL_TABLET | Freq: Every day | ORAL | Status: DC
  Administered 2014-04-30 – 2014-05-01 (×2): 5 mg via ORAL
  Filled 2014-04-30 (×2): qty 1

## 2014-04-30 MED ORDER — FAMOTIDINE 20 MG PO TABS
40.0000 mg | ORAL_TABLET | Freq: Once | ORAL | Status: AC
  Administered 2014-04-30: 40 mg via ORAL
  Filled 2014-04-30: qty 2

## 2014-04-30 MED ORDER — ATORVASTATIN CALCIUM 10 MG PO TABS
10.0000 mg | ORAL_TABLET | Freq: Every day | ORAL | Status: DC
  Administered 2014-04-30 – 2014-05-01 (×2): 10 mg via ORAL
  Filled 2014-04-30 (×2): qty 1

## 2014-04-30 MED ORDER — ONDANSETRON HCL 4 MG/2ML IJ SOLN: 4.0000 mg | Freq: Four times a day (QID) | INTRAMUSCULAR | Status: DC | PRN

## 2014-04-30 MED ORDER — GI COCKTAIL ~~LOC~~
30.0000 mL | Freq: Once | ORAL | Status: AC
  Administered 2014-04-30: 30 mL via ORAL
  Filled 2014-04-30: qty 30

## 2014-04-30 MED ORDER — FLUOXETINE HCL 20 MG PO CAPS
40.0000 mg | ORAL_CAPSULE | Freq: Every day | ORAL | Status: DC
  Administered 2014-04-30 – 2014-05-01 (×2): 40 mg via ORAL
  Filled 2014-04-30 (×2): qty 2

## 2014-04-30 MED ORDER — SODIUM CHLORIDE 0.9 % IV BOLUS (SEPSIS)
1000.0000 mL | Freq: Once | INTRAVENOUS | Status: AC
  Administered 2014-04-30: 1000 mL via INTRAVENOUS

## 2014-04-30 MED ORDER — HYDROCODONE-ACETAMINOPHEN 5-325 MG PO TABS
1.0000 | ORAL_TABLET | Freq: Four times a day (QID) | ORAL | Status: DC | PRN
Start: 2014-04-30 — End: 2014-05-01
  Administered 2014-04-30 – 2014-05-01 (×3): 2 via ORAL
  Filled 2014-04-30 (×3): qty 2

## 2014-04-30 MED ORDER — HYDROMORPHONE HCL 1 MG/ML IJ SOLN
0.5000 mg | INTRAMUSCULAR | Status: DC | PRN
Start: 2014-04-30 — End: 2014-05-01
  Administered 2014-04-30: 0.5 mg via INTRAVENOUS
  Filled 2014-04-30: qty 1

## 2014-04-30 MED ORDER — SODIUM CHLORIDE 0.9 % IV SOLN
INTRAVENOUS | Status: AC
Start: 2014-04-30 — End: 2014-05-01
  Administered 2014-04-30 – 2014-05-01 (×3): via INTRAVENOUS

## 2014-04-30 MED ORDER — ACETAMINOPHEN 650 MG RE SUPP: 650.0000 mg | Freq: Four times a day (QID) | RECTAL | Status: DC | PRN

## 2014-04-30 MED ORDER — ENOXAPARIN SODIUM 40 MG/0.4ML ~~LOC~~ SOLN
40.0000 mg | Freq: Every day | SUBCUTANEOUS | Status: DC
  Administered 2014-04-30 – 2014-05-01 (×2): 40 mg via SUBCUTANEOUS
  Filled 2014-04-30 (×2): qty 0.4

## 2014-04-30 MED ORDER — HYDRALAZINE HCL 20 MG/ML IJ SOLN: 10.0000 mg | INTRAMUSCULAR | Status: DC | PRN

## 2014-04-30 MED ORDER — SODIUM CHLORIDE 0.9 % IV SOLN
INTRAVENOUS | Status: DC
  Administered 2014-04-30: 125 mL/h via INTRAVENOUS

## 2014-04-30 MED ORDER — HYDROMORPHONE HCL 1 MG/ML IJ SOLN
1.0000 mg | Freq: Once | INTRAMUSCULAR | Status: AC
  Administered 2014-04-30: 1 mg via INTRAVENOUS
  Filled 2014-04-30: qty 1

## 2014-04-30 MED ORDER — ONDANSETRON HCL 4 MG PO TABS: 4.0000 mg | ORAL_TABLET | Freq: Four times a day (QID) | ORAL | Status: DC | PRN

## 2014-04-30 MED ORDER — LITHIUM CARBONATE ER 300 MG PO TBCR
300.0000 mg | EXTENDED_RELEASE_TABLET | Freq: Two times a day (BID) | ORAL | Status: DC
  Administered 2014-04-30 – 2014-05-01 (×3): 300 mg via ORAL
  Filled 2014-04-30 (×4): qty 1

## 2014-04-30 MED ORDER — ACETAMINOPHEN 325 MG PO TABS: 650.0000 mg | ORAL_TABLET | Freq: Four times a day (QID) | ORAL | Status: DC | PRN

## 2014-04-30 MED ORDER — LORAZEPAM 2 MG/ML IJ SOLN
1.0000 mg | Freq: Once | INTRAMUSCULAR | Status: AC
  Administered 2014-04-30: 1 mg via INTRAVENOUS
  Filled 2014-04-30: qty 1

## 2014-04-30 MED ORDER — HYDROMORPHONE HCL 1 MG/ML IJ SOLN
INTRAMUSCULAR | Status: AC
  Filled 2014-04-30: qty 1

## 2014-04-30 NOTE — ED Notes (Signed)
MD at bedside. 

## 2014-04-30 NOTE — ED Notes (Signed)
CT notified that pt finished her contrast.

## 2014-04-30 NOTE — H&P (Signed)
[facility] History and Physical  KIMBA LOTTES ZOX:[id number] DOB: Jul 18, 1970 DOA: 04/30/2014  Referring physician: ER physician. PCP: No primary care provider on file.   Chief Complaint: Left upper quadrant pain.  HPI: Miranda Bernard is a 44 y.o. female with history of hypertension and hyperlipidemia, bipolar disorder and ongoing tobacco abuse presents to the ER because of left upper quadrant pain. Patient states she has been having left upper quadrant pain over the last 2 days which has been constant associated with nausea and vomiting. Denies any diarrhea. Patient also felt numbness on the left side 2 days ago which lasted for 1-2 hours. Denies any headache or visual symptoms or any focal deficits. In the ER CT abdomen and pelvis shows splenic infarcts. Patient has been admitted for further workup. Patient's EKG shows normal sinus rhythm. Patient had recent toothache in the left upper molar. Patient was given penicillin and advised to follow-up with dental surgeon which patient is still to follow-up with. Patient denies any fever chills. Denies any previous history of DVT. Denies any chest pain or shortness of breath. On exam patient is nonfocal. CT head is negative.   Review of Systems: As presented in the history of presenting illness, rest negative.  Past Medical History  Diagnosis Date  . Hypertension   . Depression   . Bipolar 1 disorder   . High cholesterol    Past Surgical History  Procedure Laterality Date  . Chieloplasty    . Palatoplasty    . Abdominal hysterectomy    . Tubal ligation     Social History:  reports that she has been smoking.  She does not have any smokeless tobacco history on file. She reports that she drinks alcohol. She reports that she does not use illicit drugs. Where does patient live home. Can patient participate in ADLs? Yes.  No Known Allergies  Family History:  Family History  Problem Relation Age of Onset  . CAD Mother   . CAD  Father   . CAD Brother   . Stroke Brother       Prior to Admission medications   Medication Sig Start Date End Date Taking? Authorizing Provider  atorvastatin (LIPITOR) 10 MG tablet Take 10 mg by mouth daily.   Yes Historical Provider, MD  FLUoxetine (PROZAC) 40 MG capsule Take 40 mg by mouth daily.   Yes Historical Provider, MD  HYDROcodone-acetaminophen (NORCO/VICODIN) 5-325 MG per tablet Take 1-2 tablets by mouth every 6 (six) hours as needed for moderate pain or severe pain. 04/06/14  Yes Hannah Muthersbaugh, PA-C  lisinopril (PRINIVIL,ZESTRIL) 5 MG tablet Take 5 mg by mouth daily.   Yes Historical Provider, MD  LITHIUM CARBONATE ER PO Take 300 mg by mouth 2 (two) times daily.   Yes Historical Provider, MD  penicillin v potassium (VEETID) 500 MG tablet Take 1 tablet (500 mg total) by mouth 3 (three) times daily. 04/06/14  Yes Hannah Muthersbaugh, PA-C    Physical Exam: Filed Vitals:   04/30/14 0224 04/30/14 0330 04/30/14 0402 04/30/14 0420  BP: 181/104 151/84 152/87 176/85  Pulse: 65 69 64 67  Temp:   98 F (36.7 C) 97.7 F (36.5 C)  TempSrc:   Oral Oral  Resp: Height:    5' 3.25" (1.607 m)  Weight:    81.012 kg (178 lb 9.6 oz)  SpO2: 98% 96% 99% 99%     General:  Well-developed and nourished.  Eyes: Anicteric no pallor.  ENT: No  discharge from ears eyes nose and mouth. Tooth caries.  Neck: No mass felt.  Cardiovascular: S1-S2 heard.  Respiratory: No rhonchi or crepitations.  Abdomen: Soft mild left upper quadrant tenderness. No guarding or rigidity.  Skin: No rash.  Musculoskeletal: No edema.  Psychiatric: Appears normal.  Neurologic: Alert awake oriented to time place and person. Moves all extremities.  Labs on Admission:  Basic Metabolic Panel:  Recent Labs Lab 04/29/14 2335  NA 141  K 3.5  CL 105  CO2 20  GLUCOSE 129*  BUN 7  CREATININE 0.95  CALCIUM 9.6   Liver Function Tests:  Recent Labs Lab 04/29/14 2335  AST 30  ALT  15  ALKPHOS 69  BILITOT 0.6  PROT 6.9  ALBUMIN 4.0    Recent Labs Lab 04/29/14 2335  LIPASE 52   No results for input(s): AMMONIA in the last 168 hours. CBC:  Recent Labs Lab 04/29/14 2335  WBC 12.0*  HGB 12.1  HCT 36.2  MCV 107.4*  PLT 251   Cardiac Enzymes:  Recent Labs Lab 04/29/14 2335  TROPONINI <0.03    BNP (last 3 results) No results for input(s): PROBNP in the last 8760 hours. CBG: No results for input(s): GLUCAP in the last 168 hours.  Radiological Exams on Admission: Dg Chest 2 View  04/30/2014   CLINICAL DATA:  Acute onset of left-sided chest pain and abdominal pain, with vomiting and cough. Initial encounter.  EXAM: CHEST  2 VIEW  COMPARISON:  None.  FINDINGS: The lungs are well-aerated and clear. There is no evidence of focal opacification, pleural effusion or pneumothorax.  The heart is normal in size; the mediastinal contour is within normal limits. No acute osseous abnormalities are seen.  IMPRESSION: No acute cardiopulmonary process seen.   Electronically Signed   By: Roanna RaiderJeffery  Chang M.D.   On: 04/30/2014 00:30   Ct Head Wo Contrast  04/30/2014   CLINICAL DATA:  LEFT leg and hand numbness.  EXAM: CT HEAD WITHOUT CONTRAST  TECHNIQUE: Contiguous axial images were obtained from the base of the skull through the vertex without intravenous contrast.  COMPARISON:  None.  FINDINGS: The ventricles and sulci are normal. No intraparenchymal hemorrhage, mass effect nor midline shift. No acute large vascular territory infarcts.  No abnormal extra-axial fluid collections. Basal cisterns are patent.  No skull fracture. Partially imaged rhinoplasty. The included ocular globes and orbital contents are non-suspicious. Hypertelorism. The mastoid aircells and included paranasal sinuses are well-aerated.  IMPRESSION: No acute intracranial process. Normal noncontrast CT of the head. It is clinical concern for acute ischemia, MRI of the brain with diffusion-weighted sequences  would be more sensitive.   Electronically Signed   By: Awilda Metroourtnay  Bloomer   On: 04/30/2014 04:08   Ct Abdomen Pelvis W Contrast  04/30/2014   CLINICAL DATA:  Generalized abdominal pain.  Vomiting.  EXAM: CT ABDOMEN AND PELVIS WITH CONTRAST  TECHNIQUE: Multidetector CT imaging of the abdomen and pelvis was performed using the standard protocol following bolus administration of intravenous contrast.  CONTRAST:  100mL OMNIPAQUE IOHEXOL 300 MG/ML  SOLN  COMPARISON:  None.  FINDINGS: Mild atelectasis at the right lung base.  No consolidation.  There are no focal hepatic lesions. The liver is at the upper limits normal in size. The gallbladder is decompressed. There is a probable phrygian cap. No biliary dilatation.  Peripheral wedge-shaped hypodensities within the spleen, largest measuring 2.2 cm in the upper aspect. Additional smaller defects are seen in the lower and lateral spleen.  No perisplenic free fluid.  The pancreas and adrenal glands are normal. Kidneys demonstrate symmetric enhancement and excretion. Probable tiny parapelvic cyst in the upper left kidney.  There are no dilated or thickened bowel loops. No free air, free fluid, or intra-abdominal fluid collection. The appendix is normal.  The abdominal aorta is normal in caliber. There is no retroperitoneal adenopathy.  The urinary bladder is distended. The uterus is surgically absent. There is no adnexal mass. No pelvic free fluid.  No acute osseous abnormality. There is mild degenerative change in both hips. Minimal cortical irregularity right anterior superior iliac crest, may be sequela of remote prior injury.  IMPRESSION: 1. Peripherally wedge shaped hypodensities in the spleen, largest measures 2.2 cm. These are most consistent with splenic infarcts. No surrounding free fluid. 2. Post hysterectomy.   Electronically Signed   By: Rubye Oaks M.D.   On: 04/30/2014 02:27    EKG: Independently reviewed. Normal sinus  rhythm.  Assessment/Plan Principal Problem:   Splenic infarct Active Problems:   Abdominal pain   Hypertension, uncontrolled   Numbness on left side   Hyperlipidemia   Bipolar disorder   1. Splenic infarct - cause not known. At this time I have ordered blood cultures and hypercoagulable workup. Will keep patient on clear liquids and continue with hydration and pain relief medications for now. May discuss with hematologist in a.m. 2. Left-sided numbness - patient had transient left-sided numbness 2 days ago. CT head is negative for anything acute. Since patient has splenic infarct we will also check MRI brain. 3. Hypertension uncontrolled - continue home medication and at this time I have also place patient on when necessary IV hydralazine. Blood pressure may be uncontrolled secondary to #1. 4. Hyperlipidemia - on statins. 5. Tobacco abuse - advised to quit tobacco use. 6. Bipolar disorder - check lithium levels.   DVT Prophylaxis Lovenox.  Code Status: Full code.  Family Communication: Patient's family at the bedside.  Disposition Plan: Admit to inpatient.    Zephaniah Enyeart N. [facility] Pager [phone].  If 7PM-7AM, please contact night-coverage [email or web address] Password [facility] 04/30/2014, 5:24 AM

## 2014-04-30 NOTE — ED Notes (Signed)
Spoke with main lab - will add hepatic function panel and lipase.

## 2014-04-30 NOTE — Progress Notes (Signed)
Echocardiogram 2D Echocardiogram has been performed.  Miranda Bernard 04/30/2014, 10:29 AM

## 2014-04-30 NOTE — ED Provider Notes (Signed)
CSN: [id number]     Arrival date & time 04/29/14  2307 History   This chart was scribed for Toy BakerAnthony T Dody Smartt, MD by Annye AsaAnna Dorsett, ED Scribe. This patient was seen in room B14C/B14C and the patient's care was started at 12:52 AM.    Chief Complaint  Patient presents with  . Shortness of Breath  . Numbness  . Chest Pain   Patient is a 44 y.o. female presenting with shortness of breath and chest pain. The history is provided by the patient. No language interpreter was used.  Shortness of Breath Associated symptoms: abdominal pain, chest pain and vomiting   Chest Pain Associated symptoms: abdominal pain, shortness of breath and vomiting      HPI Comments: Miranda Bernard is a 44 y.o. female with past medical history of HTN, HLD who presents to the Emergency Department complaining of 2 days of "sharp" left sided body pain; left-sided abdominal pain radiating down her left side to her left leg. She also reports an episode of vomiting yesterday. Her pain worsens with eating and ambulation but improves slightly at rest. She attempted to treat her symptoms with Alleve, which provided no relief. She denies prior experience with similar symptoms to this level of severity. She denies cough, black or bloody stools. She denies recent trauma, injury, or possible strain to the area.   Past Medical History  Diagnosis Date  . Hypertension   . Depression   . Bipolar 1 disorder   . High cholesterol    Past Surgical History  Procedure Laterality Date  . Chieloplasty    . Palatoplasty    . Abdominal hysterectomy     No family history on file. History  Substance Use Topics  . Smoking status: Current Every Day Smoker  . Smokeless tobacco: Not on file  . Alcohol Use: Yes   OB History    No data available     Review of Systems  Respiratory: Positive for shortness of breath.   Cardiovascular: Positive for chest pain.  Gastrointestinal: Positive for vomiting and abdominal pain.  All other systems  reviewed and are negative.   Allergies  Review of patient's allergies indicates no known allergies.  Home Medications   Prior to Admission medications   Medication Sig Start Date End Date Taking? Authorizing Provider  HYDROcodone-acetaminophen (NORCO/VICODIN) 5-325 MG per tablet Take 1-2 tablets by mouth every 6 (six) hours as needed for moderate pain or severe pain. 04/06/14   Hannah Muthersbaugh, PA-C  penicillin v potassium (VEETID) 500 MG tablet Take 1 tablet (500 mg total) by mouth 3 (three) times daily. 04/06/14   Hannah Muthersbaugh, PA-C   BP 158/81 mmHg  Pulse 81  Temp(Src) 98.2 F (36.8 C) (Oral)  Resp 22  Ht 5' 3.25" (1.607 m)  Wt 175 lb (79.379 kg)  BMI 30.74 kg/m2  SpO2 99% Physical Exam  Constitutional: She is oriented to person, place, and time. She appears well-developed and well-nourished.  Non-toxic appearance. No distress.  HENT:  Head: Normocephalic and atraumatic.  Eyes: Conjunctivae, EOM and lids are normal. Pupils are equal, round, and reactive to light.  Neck: Normal range of motion. Neck supple. No tracheal deviation present. No thyroid mass present.  Cardiovascular: Normal rate, regular rhythm and normal heart sounds.  Exam reveals no gallop.   No murmur heard. Pulmonary/Chest: Effort normal and breath sounds normal. No stridor. No respiratory distress. She has no decreased breath sounds. She has no wheezes. She has no rhonchi. She has no rales.  Abdominal: Soft. Normal appearance and bowel sounds are normal. She exhibits no distension. There is tenderness. There is no rebound and no CVA tenderness.  Epigastric and LUQ TTP; mild guarding, no rebound, no peritoneal signs   Musculoskeletal: Normal range of motion. She exhibits no edema or tenderness.  Neurological: She is alert and oriented to person, place, and time. She has normal strength and normal reflexes. No cranial nerve deficit or sensory deficit. Coordination normal. GCS eye subscore is 4. GCS  verbal subscore is 5. GCS motor subscore is 6.  Skin: Skin is warm and dry. No abrasion and no rash noted.  Psychiatric: She has a normal mood and affect. Her speech is normal and behavior is normal.  Nursing note and vitals reviewed.   ED Course  Procedures   DIAGNOSTIC STUDIES: Oxygen Saturation is 99% on RA, normal by my interpretation.    COORDINATION OF CARE: 12:57 AM Discussed treatment plan with pt at bedside and pt agreed to plan.   Labs Review Labs Reviewed  CBC - Abnormal; Notable for the following:    WBC 12.0 (*)    RBC 3.37 (*)    MCV 107.4 (*)    MCH 35.9 (*)    All other components within normal limits  BASIC METABOLIC PANEL - Abnormal; Notable for the following:    Glucose, Bld 129 (*)    GFR calc non Af Amer 72 (*)    GFR calc Af Amer 83 (*)    Anion gap 16 (*)    All other components within normal limits  TROPONIN I  I-STAT TROPOININ, ED    Imaging Review Dg Chest 2 View  04/30/2014   CLINICAL DATA:  Acute onset of left-sided chest pain and abdominal pain, with vomiting and cough. Initial encounter.  EXAM: CHEST  2 VIEW  COMPARISON:  None.  FINDINGS: The lungs are well-aerated and clear. There is no evidence of focal opacification, pleural effusion or pneumothorax.  The heart is normal in size; the mediastinal contour is within normal limits. No acute osseous abnormalities are seen.  IMPRESSION: No acute cardiopulmonary process seen.   Electronically Signed   By: Roanna Raider M.D.   On: 04/30/2014 00:30     EKG Interpretation   Date/Time:  Sunday April 29 2014 23:26:55 EST Ventricular Rate:  79 PR Interval:  158 QRS Duration: 78 QT Interval:  398 QTC Calculation: 456 R Axis:   54 Text Interpretation:  Normal sinus rhythm Normal ECG Confirmed by Morio Widen   MD, Kyerra Vargo ([id number]) on 04/30/2014 12:50:24 AM      MDM   Final diagnoses:  Chest pain    I personally performed the services described in this documentation, which was scribed in my  presence. The recorded information has been reviewed and is accurate.  Patient given pain meds here feels better. Will be admitted for further evaluation     Toy Baker, MD 04/30/14 [phone]

## 2014-04-30 NOTE — Progress Notes (Signed)
Patient Demographics  Miranda Bernard, is a 44 y.o. female, DOB - June 13, 1970, [id number]  Admit date - 04/30/2014   Admitting Physician Eduard Clos, MD  Outpatient Primary MD for the patient is No primary care provider on file.  LOS - 0   Chief Complaint  Patient presents with  . Shortness of Breath  . Numbness  . Chest Pain      Admission history of present illness/brief narrative:  Miranda Bernard is a 44 y.o. female with history of hypertension and hyperlipidemia, bipolar disorder and ongoing tobacco abuse presents to the ER because of left upper quadrant pain In the ER CT abdomen and pelvis shows splenic infarcts. Patient has been admitted for further workup. Patient's EKG shows normal sinus rhythm. Patient had recent toothache in the left upper molar. Patient was given penicillin and advised to follow-up with dental surgeon which patient is still to follow-up with. Patient denies any fever chills. Denies any previous history of DVT. Denies any chest pain or shortness of breath. On exam patient is nonfocal. CT head is negative.   Subjective:   Miranda Bernard today has, No headache, No chest pain - No Nausea, No new weakness tingling or numbness, No Cough - SOB. Still complaining of abdominal pain  Assessment & Plan    Principal Problem:   Splenic infarct Active Problems:   Abdominal pain   Hypertension, uncontrolled   Numbness on left side   Hyperlipidemia   Bipolar disorder  Splenic infarct -m unknown etiology - Follow on hypercoagulable workup - 2-D echo showing ejection fraction 45% with diffuse hypokinesis, but no evidence of embolic source. - Continue with when necessary pain and nausea medicine.  Left-sided numbness - MRI of the brain showing no acute finding  Hypertension - Poorly controlled - Continue with home medication,  add when necessary hydralazine  Hyperlipidemia - Continue with statin  Bipolar disorder - Continue with lithium, check level  Tobacco abuse - Patient was counseled     Code Status: Full  Family Communication: Family at bedside  Disposition Plan: Home   Procedures  2-D echo MRI brain   Consults  None   Medications  Scheduled Meds: . atorvastatin  10 mg Oral Daily  . enoxaparin (LOVENOX) injection  40 mg Subcutaneous Daily  . FLUoxetine  40 mg Oral Daily  . lisinopril  5 mg Oral Daily  . lithium carbonate  300 mg Oral BID   Continuous Infusions: . sodium chloride 100 mL/hr at 04/30/14 1624   PRN Meds:.acetaminophen **OR** acetaminophen, hydrALAZINE, HYDROcodone-acetaminophen, HYDROmorphone (DILAUDID) injection, ondansetron **OR** ondansetron (ZOFRAN) IV  DVT Prophylaxis  Lovenox -  Lab Results  Component Value Date   PLT 205 04/30/2014    Antibiotics   Anti-infectives    Start     Dose/Rate Route Frequency Ordered Stop   04/30/14 1000  penicillin v potassium (VEETID) tablet 500 mg  Status:  Discontinued     500 mg Oral 3 times daily 04/30/14 0423 04/30/14 0432          Objective:   Filed Vitals:   04/30/14 0330 04/30/14 0402 04/30/14 0420 04/30/14 1415  BP: 151/84 152/87 176/85 135/65  Pulse: 69 64 67 68  Temp:  98 F (36.7 C) 97.7  F (36.5 C) 98.2 F (36.8 C)  TempSrc:  Oral Oral Oral  Resp: 17 14 16 16   Height:   5' 3.25" (1.607 m)   Weight:   81.012 kg (178 lb 9.6 oz)   SpO2: 96% 99% 99% 100%    Wt Readings from Last 3 Encounters:  04/30/14 81.012 kg (178 lb 9.6 oz)    No intake or output data in the 24 hours ending 04/30/14 1729   Physical Exam  Awake Alert, Oriented X 3, No new F.N deficits, Normal affect [place].AT,PERRAL Supple Neck,No JVD, No cervical lymphadenopathy appriciated.  Symmetrical Chest wall movement, Good air movement bilaterally, CTAB RRR,No Gallops,Rubs or new Murmurs, No Parasternal Heave +ve B.Sounds, Abd  Soft, left side abdominal pain to tenderness, No organomegaly appriciated, No rebound - guarding or rigidity. No Cyanosis, Clubbing or edema, No new Rash or bruise     Data Review   Micro Results No results found for this or any previous visit (from the past 240 hour(s)).  Radiology Reports Dg Chest 2 View  04/30/2014   CLINICAL DATA:  Acute onset of left-sided chest pain and abdominal pain, with vomiting and cough. Initial encounter.  EXAM: CHEST  2 VIEW  COMPARISON:  None.  FINDINGS: The lungs are well-aerated and clear. There is no evidence of focal opacification, pleural effusion or pneumothorax.  The heart is normal in size; the mediastinal contour is within normal limits. No acute osseous abnormalities are seen.  IMPRESSION: No acute cardiopulmonary process seen.   Electronically Signed   By: Roanna RaiderJeffery  Chang M.D.   On: 04/30/2014 00:30   Ct Head Wo Contrast  04/30/2014   CLINICAL DATA:  LEFT leg and hand numbness.  EXAM: CT HEAD WITHOUT CONTRAST  TECHNIQUE: Contiguous axial images were obtained from the base of the skull through the vertex without intravenous contrast.  COMPARISON:  None.  FINDINGS: The ventricles and sulci are normal. No intraparenchymal hemorrhage, mass effect nor midline shift. No acute large vascular territory infarcts.  No abnormal extra-axial fluid collections. Basal cisterns are patent.  No skull fracture. Partially imaged rhinoplasty. The included ocular globes and orbital contents are non-suspicious. Hypertelorism. The mastoid aircells and included paranasal sinuses are well-aerated.  IMPRESSION: No acute intracranial process. Normal noncontrast CT of the head. It is clinical concern for acute ischemia, MRI of the brain with diffusion-weighted sequences would be more sensitive.   Electronically Signed   By: Awilda Metroourtnay  Bloomer   On: 04/30/2014 04:08   Mr Brain Wo Contrast  04/30/2014   CLINICAL DATA:  Left upper and lower extremity numbness.  EXAM: MRI HEAD WITHOUT  CONTRAST  TECHNIQUE: Multiplanar, multiecho pulse sequences of the brain and surrounding structures were obtained without intravenous contrast.  COMPARISON:  04/30/2014  FINDINGS: The cerebellar tonsils extend 3 mm below the foramen magnum. Posterior fossa structures are otherwise within normal limits. Midline structures are normal.  No acute infarct, hemorrhage, or mass lesion is present. The ventricles are of normal size. No significant extraaxial fluid collection is present.  Flow is present in the major intracranial arteries. The globes and orbits are intact. Small polyps or mucous retention cysts are evident in the maxillary sinuses bilaterally.  Note is made of a benign appearing cyst of the incisive canal in the anterior hard palate measuring 1.4 x 2.4 x 1.3 cm.  IMPRESSION: 1. Normal MRI appearance brain. No acute intracranial abnormality or focal lesion to explain the patient's symptoms. 2. 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 3.  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 4. Minimal sinus disease.   Electronically Signed   By: Gennette Pac M.D.   On: 04/30/2014 08:14   Ct Abdomen Pelvis W Contrast  04/30/2014   CLINICAL DATA:  Generalized abdominal pain.  Vomiting.  EXAM: CT ABDOMEN AND PELVIS WITH CONTRAST  TECHNIQUE: Multidetector CT imaging of the abdomen and pelvis was performed using the standard protocol following bolus administration of intravenous contrast.  CONTRAST:  OMNIPAQUE IOHEXOL 300 MG/ML  SOLN  COMPARISON:  None.  FINDINGS: Mild atelectasis at the right lung base.  No consolidation.  There are no focal hepatic lesions. The liver is at the upper limits normal in size. The gallbladder is decompressed. There is a probable phrygian cap. No biliary dilatation.  Peripheral wedge-shaped hypodensities within the spleen, largest measuring 2.2 cm in the upper aspect. Additional smaller defects are seen in  the lower and lateral spleen. No perisplenic free fluid.  The pancreas and adrenal glands are normal. Kidneys demonstrate symmetric enhancement and excretion. Probable tiny parapelvic cyst in the upper left kidney.  There are no dilated or thickened bowel loops. No free air, free fluid, or intra-abdominal fluid collection. The appendix is normal.  The abdominal aorta is normal in caliber. There is no retroperitoneal adenopathy.  The urinary bladder is distended. The uterus is surgically absent. There is no adnexal mass. No pelvic free fluid.  No acute osseous abnormality. There is mild degenerative change in both hips. Minimal cortical irregularity right anterior superior iliac crest, may be sequela of remote prior injury.  IMPRESSION: 1. Peripherally wedge shaped hypodensities in the spleen, largest measures 2.2 cm. These are most consistent with splenic infarcts. No surrounding free fluid. 2. Post hysterectomy.   Electronically Signed   By: Rubye Oaks M.D.   On: 04/30/2014 02:27    CBC  Recent Labs Lab 04/29/14 2335 04/30/14 0536  WBC 12.0* 9.8  HGB 12.1 11.7*  HCT 36.2 35.4*  PLT 251 205  MCV 107.4* 107.6*  MCH 35.9* 35.6*  MCHC 33.4 33.1  RDW 13.1 13.1  LYMPHSABS  --  3.7  MONOABS  --  0.7  EOSABS  --  0.3  BASOSABS  --  0.1    Chemistries   Recent Labs Lab 04/29/14 2335 04/30/14 0536  NA 141 137  K 3.5 3.4*  CL 105 107  CO2 20 22  GLUCOSE 129* 104*  BUN 7 8  CREATININE 0.95 0.78  CALCIUM 9.6 8.5  AST 30 20  ALT 15 14  ALKPHOS 69 64  BILITOT 0.6 0.6   ------------------------------------------------------------------------------------------------------------------ estimated creatinine clearance is 91 mL/min (by C-G formula based on Cr of 0.78). ------------------------------------------------------------------------------------------------------------------ No results for input(s): HGBA1C in the last 72  hours. ------------------------------------------------------------------------------------------------------------------ No results for input(s): CHOL, HDL, LDLCALC, TRIG, CHOLHDL, LDLDIRECT in the last 72 hours. ------------------------------------------------------------------------------------------------------------------ No results for input(s): TSH, T4TOTAL, T3FREE, THYROIDAB in the last 72 hours.  Invalid input(s): FREET3 ------------------------------------------------------------------------------------------------------------------ No results for input(s): VITAMINB12, FOLATE, FERRITIN, TIBC, IRON, RETICCTPCT in the last 72 hours.  Coagulation profile No results for input(s): INR, PROTIME in the last 168 hours.  No results for input(s): DDIMER in the last 72 hours.  Cardiac Enzymes  Recent Labs Lab 04/29/14 2335  TROPONINI <0.03   ------------------------------------------------------------------------------------------------------------------ Invalid input(s): POCBNP     Time Spent in minutes   30 minutes   ELGERGAWY, DAWOOD M.D on 04/30/2014 at 5:29 PM  Between 7am to 7pm -  Pager - [phone]  After 7pm go to [email or web address] - password [email or web address]  And look for the night coverage person covering for me after hours  [facility] Office  [phone]   **Disclaimer: This note may have been dictated with voice recognition software. Similar sounding words can inadvertently be transcribed and this note may contain transcription errors which may not have been corrected upon publication of note.**

## 2014-05-01 LAB — CBC
HCT: 33.8 % — ABNORMAL LOW (ref 36.0–46.0)
HEMOGLOBIN: 11.1 g/dL — AB (ref 12.0–15.0)
MCH: 36.5 pg — ABNORMAL HIGH (ref 26.0–34.0)
MCHC: 32.8 g/dL (ref 30.0–36.0)
MCV: 111.2 fL — AB (ref 78.0–100.0)
PLATELETS: 178 10*3/uL (ref 150–400)
RBC: 3.04 MIL/uL — AB (ref 3.87–5.11)
RDW: 13.3 % (ref 11.5–15.5)
WBC: 6.6 10*3/uL (ref 4.0–10.5)

## 2014-05-01 LAB — CARDIOLIPIN ANTIBODIES, IGG, IGM, IGA
ANTICARDIOLIPIN IGA: 8 U/mL — AB (ref <22)
ANTICARDIOLIPIN IGG: 4 GPL U/mL — AB (ref <23)
ANTICARDIOLIPIN IGM: 3 [MPL'U]/mL — AB (ref <11)

## 2014-05-01 LAB — LUPUS ANTICOAGULANT PANEL
DRVVT: 38.3 s (ref <42.9)
Lupus Anticoagulant: NOT DETECTED
PTT Lupus Anticoagulant: 41.4 secs (ref 28.0–43.0)

## 2014-05-01 LAB — BASIC METABOLIC PANEL
Anion gap: 3 — ABNORMAL LOW (ref 5–15)
BUN: <5 mg/dL — ABNORMAL LOW (ref 6–23)
CHLORIDE: 108 meq/L (ref 96–112)
CO2: 26 mmol/L (ref 19–32)
Calcium: 8.2 mg/dL — ABNORMAL LOW (ref 8.4–10.5)
Creatinine, Ser: 0.77 mg/dL (ref 0.50–1.10)
GFR calc Af Amer: >90 mL/min (ref >90)
GFR calc non Af Amer: >90 mL/min (ref >90)
GLUCOSE: 112 mg/dL — AB (ref 70–99)
Potassium: 3.5 mmol/L (ref 3.5–5.1)
SODIUM: 137 mmol/L (ref 135–145)

## 2014-05-01 LAB — BETA-2-GLYCOPROTEIN I ABS, IGG/M/A
BETA-2-GLYCOPROTEIN I IGM: 5 M Units (ref <20)
Beta-2 Glyco I IgG: 10 G Units (ref <20)
Beta-2-Glycoprotein I IgA: 4 A Units (ref <20)

## 2014-05-01 LAB — HOMOCYSTEINE: HOMOCYSTEINE-NORM: 49.5 umol/L — AB (ref 0.0–15.0)

## 2014-05-01 MED ORDER — WHITE PETROLATUM GEL
Status: AC
  Filled 2014-05-01: qty 1

## 2014-05-01 MED ORDER — HYDROCODONE-ACETAMINOPHEN 5-325 MG PO TABS: 1.0000 | ORAL_TABLET | Freq: Four times a day (QID) | ORAL | Status: DC | PRN

## 2014-05-01 MED ORDER — SENNOSIDES-DOCUSATE SODIUM 8.6-50 MG PO TABS: 1.0000 | ORAL_TABLET | Freq: Every day | ORAL | Status: AC

## 2014-05-01 MED ORDER — ASPIRIN 81 MG PO TABS: 81.0000 mg | ORAL_TABLET | Freq: Every day | ORAL | Status: DC

## 2014-05-01 MED ORDER — AMLODIPINE BESYLATE 10 MG PO TABS: 10.0000 mg | ORAL_TABLET | Freq: Every day | ORAL | Status: DC

## 2014-05-01 NOTE — Discharge Summary (Signed)
Miranda Bernard, is a 44 y.o. female  DOB 08/07/1970  MRN [id number].  Admission date:  04/30/2014  Admitting Physician  Eduard Clos, MD  Discharge Date:  05/01/2014   Primary MD  No primary care provider on file. Eileen Stanford NP, work with Dr. Andi Devon   Recommendations for primary care physician for things to follow:  - Please follow on hypercoagulable workup results. - Please follow on blood culture results. - Patient had 2-D echo showing ejection fraction 45%, with global hypokinesis, this is more likely picture due to uncontrolled blood pressure, would recommend cardiology follow-up on nonurgent basis.   Admission Diagnosis  Generalized abdominal pain [R10.84] Numbness on left side [R20.0] Abdominal pain [R10.9] Chest pain [R07.9]   Discharge Diagnosis  Generalized abdominal pain [R10.84] Numbness on left side [R20.0] Abdominal pain [R10.9] Chest pain [R07.9]    Principal Problem:   Splenic infarct Active Problems:   Abdominal pain   Hypertension, uncontrolled   Numbness on left side   Hyperlipidemia   Bipolar disorder      Past Medical History  Diagnosis Date  . Hypertension   . Depression   . Bipolar 1 disorder   . High cholesterol     Past Surgical History  Procedure Laterality Date  . Chieloplasty    . Palatoplasty    . Abdominal hysterectomy    . Tubal ligation         History of present illness and  Hospital Course:     Kindly see H&P for history of present illness and admission details, please review complete Labs, Consult reports and Test reports for all details in brief  HPI  from the history and physical done on the day of admission  Miranda Bernard is a 44 y.o. female with history of hypertension and hyperlipidemia, bipolar disorder and ongoing tobacco abuse presents to the  ER because of left upper quadrant pain. Patient states she has been having left upper quadrant pain over the last 2 days which has been constant associated with nausea and vomiting. Denies any diarrhea. Patient also felt numbness on the left side 2 days ago which lasted for 1-2 hours. Denies any headache or visual symptoms or any focal deficits. In the ER CT abdomen and pelvis shows splenic infarcts. Patient has been admitted for further workup. Patient's EKG shows normal sinus rhythm. Patient had recent toothache in the left upper molar. Patient was given penicillin and advised to follow-up with dental surgeon which patient is still to follow-up with. Patient denies any fever chills. Denies any previous history of DVT. Denies any chest pain or shortness of breath. On exam patient is nonfocal. CT head is negative.    Hospital Course    Splenic infarct - unknown etiology - Follow on hypercoagulable workup as an outpatient. - 2-D echo showing ejection fraction 45% with diffuse hypokinesis, but no evidence of embolic source. - LDH within normal limit . - Blood cultures were done, no growth till day of discharge, patient is a febrile, with  no leukocytosis, please follow on the results. - Patient was started on aspirin 81 mg daily. - When necessary pain meds for pain control on discharge.  Left-sided numbness - ET head showing no acute findings. - MRI brain was obtained, due to suspicion for embolic event causing her splenic infarct and possible TIA versus CVA, but there was no MRI brain finding of ischemic event.  Hypertension - Poorly controlled - Resume his Toprol on discharge, will start patient on Norvasc 10 mg oral daily.  Hyperlipidemia - Continue with statin  Bipolar disorder - Continue with lithium, and Prozac, lithium level is 0.07  Tobacco abuse - Patient was counseled      Discharge Condition:  Stable   Follow UP  Follow-up Information    Follow up with Eileen Stanford, RN. Schedule an appointment as soon as possible for a visit in 1 week.   Why:  To follow on hypercoagulable workup      Follow up with CLOWARD,DAVIS L, MD. Schedule an appointment as soon as possible for a visit in 1 week.   Specialty:  Internal Medicine   Why:  To follow on hypercoagulable workup   Contact information:   [address] [place] [address] [phone]         Discharge Instructions  and  Discharge Medications         Discharge Instructions    Diet - low sodium heart healthy    Complete by:  As directed      Discharge instructions    Complete by:  As directed   Follow with Primary MD  in 7 days  These have your PCP follow on  hypercoagulable workup. Get CBC, CMP, 2 view Chest X ray checked  by Primary MD next visit.    Activity: As tolerated with Full fall precautions use walker/cane & assistance as needed   Disposition Home    Diet: Heart Healthy  , with feeding assistance and aspiration precautions as needed.     On your next visit with your primary care physician please Get Medicines reviewed and adjusted.   Please request your Prim.MD to go over all Hospital Tests and Procedure/Radiological results at the follow up, please get all Hospital records sent to your Prim MD by signing hospital release before you go home.   If you experience worsening of your admission symptoms, develop shortness of breath, life threatening emergency, suicidal or homicidal thoughts you must seek medical attention immediately by calling 911 or calling your MD immediately  if symptoms less severe.  You Must read complete instructions/literature along with all the possible adverse reactions/side effects for all the Medicines you take and that have been prescribed to you. Take any new Medicines after you have completely understood and accpet all the possible adverse reactions/side effects.   Do not drive, operating heavy machinery, perform activities at  heights, swimming or participation in water activities or provide baby sitting services if your were admitted for syncope or siezures until you have seen by Primary MD or a Neurologist and advised to do so again.  Do not drive when taking Pain medications.    Do not take more than prescribed Pain, Sleep and Anxiety Medications  Special Instructions: If you have smoked or chewed Tobacco  in the last 2 yrs please stop smoking, stop any regular Alcohol  and or any Recreational drug use.  Wear Seat belts while driving.   Please note  You were cared for by a hospitalist during  your hospital stay. If you have any questions about your discharge medications or the care you received while you were in the hospital after you are discharged, you can call the unit and asked to speak with the hospitalist on call if the hospitalist that took care of you is not available. Once you are discharged, your primary care physician will handle any further medical issues. Please note that NO REFILLS for any discharge medications will be authorized once you are discharged, as it is imperative that you return to your primary care physician (or establish a relationship with a primary care physician if you do not have one) for your aftercare needs so that they can reassess your need for medications and monitor your lab values.     Increase activity slowly    Complete by:  As directed             Medication List    TAKE these medications        amLODipine 10 MG tablet  Commonly known as:  NORVASC  Take 1 tablet (10 mg total) by mouth daily.     aspirin 81 MG tablet  Take 1 tablet (81 mg total) by mouth daily.     atorvastatin 10 MG tablet  Commonly known as:  LIPITOR  Take 10 mg by mouth daily.     FLUoxetine 40 MG capsule  Commonly known as:  PROZAC  Take 40 mg by mouth daily.     HYDROcodone-acetaminophen 5-325 MG per tablet  Commonly known as:  NORCO/VICODIN  Take 1-2 tablets by mouth every 6 (six)  hours as needed for moderate pain or severe pain.     lisinopril 5 MG tablet  Commonly known as:  PRINIVIL,ZESTRIL  Take 5 mg by mouth daily.     LITHIUM CARBONATE ER PO  Take 300 mg by mouth 2 (two) times daily.     penicillin v potassium 500 MG tablet  Commonly known as:  VEETID  Take 1 tablet (500 mg total) by mouth 3 (three) times daily.     senna-docusate 8.6-50 MG per tablet  Commonly known as:  Senokot-S  Take 1 tablet by mouth daily.          Diet and Activity recommendation: See Discharge Instructions above   Consults obtained -  None   Major procedures and Radiology Reports - PLEASE review detailed and final reports for all details, in brief -      Dg Chest 2 View  04/30/2014   CLINICAL DATA:  Acute onset of left-sided chest pain and abdominal pain, with vomiting and cough. Initial encounter.  EXAM: CHEST  2 VIEW  COMPARISON:  None.  FINDINGS: The lungs are well-aerated and clear. There is no evidence of focal opacification, pleural effusion or pneumothorax.  The heart is normal in size; the mediastinal contour is within normal limits. No acute osseous abnormalities are seen.  IMPRESSION: No acute cardiopulmonary process seen.   Electronically Signed   By: Roanna Raider M.D.   On: 04/30/2014 00:30   Ct Head Wo Contrast  04/30/2014   CLINICAL DATA:  LEFT leg and hand numbness.  EXAM: CT HEAD WITHOUT CONTRAST  TECHNIQUE: Contiguous axial images were obtained from the base of the skull through the vertex without intravenous contrast.  COMPARISON:  None.  FINDINGS: The ventricles and sulci are normal. No intraparenchymal hemorrhage, mass effect nor midline shift. No acute large vascular territory infarcts.  No abnormal extra-axial fluid collections. Basal cisterns are patent.  No skull fracture. Partially imaged rhinoplasty. The included ocular globes and orbital contents are non-suspicious. Hypertelorism. The mastoid aircells and included paranasal sinuses are  well-aerated.  IMPRESSION: No acute intracranial process. Normal noncontrast CT of the head. It is clinical concern for acute ischemia, MRI of the brain with diffusion-weighted sequences would be more sensitive.   Electronically Signed   By: Awilda Metro   On: 04/30/2014 04:08   Mr Brain Wo Contrast  04/30/2014   CLINICAL DATA:  Left upper and lower extremity numbness.  EXAM: MRI HEAD WITHOUT CONTRAST  TECHNIQUE: Multiplanar, multiecho pulse sequences of the brain and surrounding structures were obtained without intravenous contrast.  COMPARISON:  04/30/2014  FINDINGS: The cerebellar tonsils extend 3 mm below the foramen magnum. Posterior fossa structures are otherwise within normal limits. Midline structures are normal.  No acute infarct, hemorrhage, or mass lesion is present. The ventricles are of normal size. No significant extraaxial fluid collection is present.  Flow is present in the major intracranial arteries. The globes and orbits are intact. Small polyps or mucous retention cysts are evident in the maxillary sinuses bilaterally.  Note is made of a benign appearing cyst of the incisive canal in the anterior hard palate measuring 1.4 x 2.4 x 1.3 cm.  IMPRESSION: 1. Normal MRI appearance brain. No acute intracranial abnormality or focal lesion to explain the patient's symptoms. 2. 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 3. 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 4. Minimal sinus disease.   Electronically Signed   By: Gennette Pac M.D.   On: 04/30/2014 08:14   Ct Abdomen Pelvis W Contrast  04/30/2014   CLINICAL DATA:  Generalized abdominal pain.  Vomiting.  EXAM: CT ABDOMEN AND PELVIS WITH CONTRAST  TECHNIQUE: Multidetector CT imaging of the abdomen and pelvis was performed using the standard protocol following bolus administration of intravenous contrast.  CONTRAST:  OMNIPAQUE IOHEXOL 300 MG/ML  SOLN   COMPARISON:  None.  FINDINGS: Mild atelectasis at the right lung base.  No consolidation.  There are no focal hepatic lesions. The liver is at the upper limits normal in size. The gallbladder is decompressed. There is a probable phrygian cap. No biliary dilatation.  Peripheral wedge-shaped hypodensities within the spleen, largest measuring 2.2 cm in the upper aspect. Additional smaller defects are seen in the lower and lateral spleen. No perisplenic free fluid.  The pancreas and adrenal glands are normal. Kidneys demonstrate symmetric enhancement and excretion. Probable tiny parapelvic cyst in the upper left kidney.  There are no dilated or thickened bowel loops. No free air, free fluid, or intra-abdominal fluid collection. The appendix is normal.  The abdominal aorta is normal in caliber. There is no retroperitoneal adenopathy.  The urinary bladder is distended. The uterus is surgically absent. There is no adnexal mass. No pelvic free fluid.  No acute osseous abnormality. There is mild degenerative change in both hips. Minimal cortical irregularity right anterior superior iliac crest, may be sequela of remote prior injury.  IMPRESSION: 1. Peripherally wedge shaped hypodensities in the spleen, largest measures 2.2 cm. These are most consistent with splenic infarcts. No surrounding free fluid. 2. Post hysterectomy.   Electronically Signed   By: Rubye Oaks M.D.   On: 04/30/2014 02:27    Micro Results   Results for orders placed or performed during the hospital encounter of 04/30/14  Culture, blood (routine x 2)     Status: None (Preliminary  result)   Collection Time: 04/30/14  5:26 AM  Result Value Ref Range Status   Specimen Description BLOOD RIGHT ARM  Final   Special Requests BOTTLES DRAWN AEROBIC ONLY 5CC  Final   Culture   Final           BLOOD CULTURE RECEIVED NO GROWTH TO DATE CULTURE WILL BE HELD FOR 5 DAYS BEFORE ISSUING A FINAL NEGATIVE REPORT Performed at [facility]    Report  Status PENDING  Incomplete  Culture, blood (routine x 2)     Status: None (Preliminary result)   Collection Time: 04/30/14  5:45 AM  Result Value Ref Range Status   Specimen Description BLOOD RIGHT HAND  Final   Special Requests BOTTLES DRAWN AEROBIC ONLY 5CC  Final   Culture   Final           BLOOD CULTURE RECEIVED NO GROWTH TO DATE CULTURE WILL BE HELD FOR 5 DAYS BEFORE ISSUING A FINAL NEGATIVE REPORT Performed at [facility]    Report Status PENDING  Incomplete     Recent Results (from the past 240 hour(s))  Culture, blood (routine x 2)     Status: None (Preliminary result)   Collection Time: 04/30/14  5:26 AM  Result Value Ref Range Status   Specimen Description BLOOD RIGHT ARM  Final   Special Requests BOTTLES DRAWN AEROBIC ONLY 5CC  Final   Culture   Final           BLOOD CULTURE RECEIVED NO GROWTH TO DATE CULTURE WILL BE HELD FOR 5 DAYS BEFORE ISSUING A FINAL NEGATIVE REPORT Performed at [facility]    Report Status PENDING  Incomplete  Culture, blood (routine x 2)     Status: None (Preliminary result)   Collection Time: 04/30/14  5:45 AM  Result Value Ref Range Status   Specimen Description BLOOD RIGHT HAND  Final   Special Requests BOTTLES DRAWN AEROBIC ONLY 5CC  Final   Culture   Final           BLOOD CULTURE RECEIVED NO GROWTH TO DATE CULTURE WILL BE HELD FOR 5 DAYS BEFORE ISSUING A FINAL NEGATIVE REPORT Performed at [facility]    Report Status PENDING  Incomplete       Today   Subjective:   Miranda Bernard today has no headache,no chest pain , abdominal pain still present, but improving,no new weakness tingling or numbness, feels much better wants to go home today.   Objective:   Blood pressure 153/93, pulse 57, temperature 98.1 F (36.7 C), temperature source Oral, resp. rate 16, height 5' 3.25" (1.607 m), weight 81.012 kg (178 lb 9.6 oz), SpO2 100 %.   Intake/Output Summary (Last 24 hours) at 05/01/14 1608 Last data filed  at 05/01/14 0900  Gross per 24 hour  Intake 2418.33 ml  Output     10 ml  Net 2408.33 ml    Exam Awake Alert, Oriented x 3, No new F.N deficits, Normal affect [place].AT,PERRAL Supple Neck,No JVD, No cervical lymphadenopathy appriciated.  Symmetrical Chest wall movement, Good air movement bilaterally, CTAB RRR,No Gallops,Rubs or new Murmurs, No Parasternal Heave +ve B.Sounds, Abd Soft, tenderness in left upper quadrant., No organomegaly appriciated, No rebound -guarding or rigidity. No Cyanosis, Clubbing or edema, No new Rash or bruise  Data Review   CBC w Diff:  Lab Results  Component Value Date   WBC 6.6 05/01/2014   WBC 6.5 05/14/2009   HGB 11.1* 05/01/2014  HGB 12.5 05/14/2009   HCT 33.8* 05/01/2014   HCT 36.6 05/14/2009   PLT 178 05/01/2014   PLT 321 05/14/2009   LYMPHOPCT 38 04/30/2014   LYMPHOPCT 46.5 05/14/2009   MONOPCT 7 04/30/2014   MONOPCT 5.6 05/14/2009   EOSPCT 4 04/30/2014   EOSPCT 5.9 05/14/2009   BASOPCT 1 04/30/2014   BASOPCT 0.8 05/14/2009    CMP:  Lab Results  Component Value Date   NA 137 05/01/2014   NA 136 05/14/2009   K 3.5 05/01/2014   K 3.3 05/14/2009   CL 108 05/01/2014   CL 100 05/14/2009   CO2 26 05/01/2014   CO2 28 05/14/2009   BUN <5* 05/01/2014   BUN 11 05/14/2009   CREATININE 0.77 05/01/2014   CREATININE 1.0 05/14/2009   PROT 6.3 04/30/2014   PROT 7.8 05/14/2009   ALBUMIN 3.5 04/30/2014   BILITOT 0.6 04/30/2014   BILITOT 0.80 05/14/2009   ALKPHOS 64 04/30/2014   ALKPHOS 70 05/14/2009   AST 20 04/30/2014   AST 30 05/14/2009   ALT 14 04/30/2014   ALT 29 05/14/2009  .   Total Time in preparing paper work, data evaluation and todays exam - 35 minutes  Heyden Jaber M.D on 05/01/2014 at 4:08 PM  [facility] Office  [phone]

## 2014-05-01 NOTE — Progress Notes (Signed)
Discharge home. Home discharge instruction given, no questions verbalized. 

## 2014-05-01 NOTE — Discharge Instructions (Signed)
Follow with Primary MD  in 7 days  These have your PCP follow on  hypercoagulable workup. Get CBC, CMP, 2 view Chest X ray checked  by Primary MD next visit.    Activity: As tolerated with Full fall precautions use walker/cane & assistance as needed   Disposition Home    Diet: Heart Healthy  , with feeding assistance and aspiration precautions as needed.     On your next visit with your primary care physician please Get Medicines reviewed and adjusted.   Please request your Prim.MD to go over all Hospital Tests and Procedure/Radiological results at the follow up, please get all Hospital records sent to your Prim MD by signing hospital release before you go home.   If you experience worsening of your admission symptoms, develop shortness of breath, life threatening emergency, suicidal or homicidal thoughts you must seek medical attention immediately by calling 911 or calling your MD immediately  if symptoms less severe.  You Must read complete instructions/literature along with all the possible adverse reactions/side effects for all the Medicines you take and that have been prescribed to you. Take any new Medicines after you have completely understood and accpet all the possible adverse reactions/side effects.   Do not drive, operating heavy machinery, perform activities at heights, swimming or participation in water activities or provide baby sitting services if your were admitted for syncope or siezures until you have seen by Primary MD or a Neurologist and advised to do so again.  Do not drive when taking Pain medications.    Do not take more than prescribed Pain, Sleep and Anxiety Medications  Special Instructions: If you have smoked or chewed Tobacco  in the last 2 yrs please stop smoking, stop any regular Alcohol  and or any Recreational drug use.  Wear Seat belts while driving.   Please note  You were cared for by a hospitalist during your hospital stay. If you have any  questions about your discharge medications or the care you received while you were in the hospital after you are discharged, you can call the unit and asked to speak with the hospitalist on call if the hospitalist that took care of you is not available. Once you are discharged, your primary care physician will handle any further medical issues. Please note that NO REFILLS for any discharge medications will be authorized once you are discharged, as it is imperative that you return to your primary care physician (or establish a relationship with a primary care physician if you do not have one) for your aftercare needs so that they can reassess your need for medications and monitor your lab values.

## 2014-05-02 LAB — PROTEIN C ACTIVITY: PROTEIN C ACTIVITY: 150 % (ref 74–151)

## 2014-05-02 LAB — PROTEIN S ACTIVITY: PROTEIN S ACTIVITY: 96 % (ref 60–145)

## 2014-05-03 LAB — PROTEIN S, TOTAL: PROTEIN S AG TOTAL: 99 % (ref 60–150)

## 2014-05-03 LAB — FACTOR 5 LEIDEN

## 2014-05-03 LAB — PROTHROMBIN GENE MUTATION

## 2014-05-03 LAB — PROTEIN C, TOTAL: PROTEIN C, TOTAL: 114 % (ref 72–160)

## 2014-05-06 LAB — CULTURE, BLOOD (ROUTINE X 2)
Culture: NO GROWTH
Culture: NO GROWTH

## 2014-08-16 ENCOUNTER — Emergency Department (HOSPITAL_COMMUNITY)
Admission: EM | Admit: 2014-08-16 | Discharge: 2014-08-16 | Disposition: A | Discharge status: Home / Self Care | Payer: BLUE CROSS/BLUE SHIELD | Attending: Emergency Medicine | Admitting: Emergency Medicine

## 2014-08-16 ENCOUNTER — Encounter (HOSPITAL_COMMUNITY): Payer: Self-pay | Admitting: Emergency Medicine

## 2014-08-16 DIAGNOSIS — Z720 Tobacco use: Secondary | ICD-10-CM | POA: Insufficient documentation

## 2014-08-16 DIAGNOSIS — E780 Pure hypercholesterolemia: Secondary | ICD-10-CM | POA: Insufficient documentation

## 2014-08-16 DIAGNOSIS — I10 Essential (primary) hypertension: Secondary | ICD-10-CM | POA: Diagnosis not present

## 2014-08-16 DIAGNOSIS — K088 Other specified disorders of teeth and supporting structures: Secondary | ICD-10-CM | POA: Insufficient documentation

## 2014-08-16 DIAGNOSIS — F329 Major depressive disorder, single episode, unspecified: Secondary | ICD-10-CM | POA: Diagnosis not present

## 2014-08-16 DIAGNOSIS — Z79899 Other long term (current) drug therapy: Secondary | ICD-10-CM | POA: Insufficient documentation

## 2014-08-16 DIAGNOSIS — Z7982 Long term (current) use of aspirin: Secondary | ICD-10-CM | POA: Diagnosis not present

## 2014-08-16 DIAGNOSIS — K029 Dental caries, unspecified: Secondary | ICD-10-CM | POA: Insufficient documentation

## 2014-08-16 MED ORDER — AMOXICILLIN 500 MG PO CAPS
1000.0000 mg | ORAL_CAPSULE | Freq: Once | ORAL | Status: AC
  Administered 2014-08-16: 1000 mg via ORAL
  Filled 2014-08-16: qty 2

## 2014-08-16 MED ORDER — AMOXICILLIN 500 MG PO CAPS: 1000.0000 mg | ORAL_CAPSULE | Freq: Two times a day (BID) | ORAL | Status: DC

## 2014-08-16 MED ORDER — OXYCODONE-ACETAMINOPHEN 5-325 MG PO TABS
1.0000 | ORAL_TABLET | Freq: Once | ORAL | Status: AC
  Administered 2014-08-16: 1 via ORAL
  Filled 2014-08-16: qty 1

## 2014-08-16 MED ORDER — OXYCODONE-ACETAMINOPHEN 5-325 MG PO TABS
1.0000 | ORAL_TABLET | ORAL | Status: DC | PRN
Start: 2014-08-16 — End: 2015-12-02

## 2014-08-16 NOTE — ED Provider Notes (Signed)
CSN: [id number]     Arrival date & time 08/16/14  0047 History  This chart was scribed for Dione Booze, MD by Tanda Rockers, ED Scribe. This patient was seen in room A12C/A12C and the patient's care was started at 1:53 AM.     Chief Complaint  Patient presents with  . Dental Pain   The history is provided by the patient. No language interpreter was used.     HPI Comments: Miranda Bernard is a 44 y.o. female with hx HTN and HLD who presents to the Emergency Department complaining of right lower dental pain that began 3 days ago, worsening today. She mentions that she broke her tooth, causing the pain. Pt states that the pain is exacerbated with light touch. She has tried Hydrogen peroxide, Aleve, and Ibuprofen without relief. The last time she took pain medication was approximately 4 hours ago. Pt does not currently have a dentist. She denies fever, chills, or any other symptoms.   Past Medical History  Diagnosis Date  . Hypertension   . Depression   . Bipolar 1 disorder   . High cholesterol    Past Surgical History  Procedure Laterality Date  . Chieloplasty    . Palatoplasty    . Abdominal hysterectomy    . Tubal ligation     Family History  Problem Relation Age of Onset  . CAD Mother   . CAD Father   . CAD Brother   . Stroke Brother    History  Substance Use Topics  . Smoking status: Current Every Day Smoker -- 0.25 packs/day for 20 years    Types: Cigarettes  . Smokeless tobacco: Never Used  . Alcohol Use: Yes     Comment: occasional   OB History    No data available     Review of Systems  Constitutional: Negative for fever and chills.  HENT: Positive for dental problem.   All other systems reviewed and are negative.     Allergies  Review of patient's allergies indicates no known allergies.  Home Medications   Prior to Admission medications   Medication Sig Start Date End Date Taking? Authorizing Provider  amLODipine (NORVASC) 10 MG tablet Take 1  tablet (10 mg total) by mouth daily. 05/01/14   Starleen Arms, MD  aspirin 81 MG tablet Take 1 tablet (81 mg total) by mouth daily. 05/01/14   Leana Roe Elgergawy, MD  atorvastatin (LIPITOR) 10 MG tablet Take 10 mg by mouth daily.    Historical Provider, MD  FLUoxetine (PROZAC) 40 MG capsule Take 40 mg by mouth daily.    Historical Provider, MD  HYDROcodone-acetaminophen (NORCO/VICODIN) 5-325 MG per tablet Take 1-2 tablets by mouth every 6 (six) hours as needed for moderate pain or severe pain. 05/01/14   Leana Roe Elgergawy, MD  lisinopril (PRINIVIL,ZESTRIL) 5 MG tablet Take 5 mg by mouth daily.    Historical Provider, MD  LITHIUM CARBONATE ER PO Take 300 mg by mouth 2 (two) times daily.    Historical Provider, MD  penicillin v potassium (VEETID) 500 MG tablet Take 1 tablet (500 mg total) by mouth 3 (three) times daily. 04/06/14   Hannah Muthersbaugh, PA-C  senna-docusate (SENOKOT-S) 8.6-50 MG per tablet Take 1 tablet by mouth daily. 05/01/14   Starleen Arms, MD   Triage Vitals: BP 181/112 mmHg  Pulse 87  Temp(Src) 98 F (36.7 C)  Resp 20  Wt 173 lb 2 oz (78.529 kg)  SpO2 100%   Physical Exam  Constitutional: She is oriented to person, place, and time. She appears well-developed and well-nourished. No distress.  HENT:  Head: Normocephalic and atraumatic.  Tooth #31 is severely carious and tender to percussion.  Some pallor and tenderness of surrounding gingiva.   Eyes: Conjunctivae and EOM are normal. Pupils are equal, round, and reactive to light.  Neck: Normal range of motion. Neck supple. No JVD present.  Cardiovascular: Normal rate, regular rhythm and normal heart sounds.   No murmur heard. Pulmonary/Chest: Effort normal and breath sounds normal. She has no wheezes. She has no rales.  Abdominal: Soft. Bowel sounds are normal. She exhibits no distension and no mass. There is no tenderness.  Musculoskeletal: Normal range of motion. She exhibits no edema.  Lymphadenopathy:     She has no cervical adenopathy.  Neurological: She is alert and oriented to person, place, and time. No cranial nerve deficit.  Skin: Skin is warm and dry. No rash noted.  Psychiatric: She has a normal mood and affect. Her behavior is normal. Judgment and thought content normal.  Nursing note and vitals reviewed.   ED Course  Procedures (including critical care time)  DIAGNOSTIC STUDIES: Oxygen Saturation is 100% on RA, normal by my interpretation.    COORDINATION OF CARE: 1:55 AM-Discussed treatment plan which includes antibiotic prescription and referral to on-call dentist, Dr. Russella DarBenitez, with pt at bedside and pt agreed to plan.    MDM   Final diagnoses:  Pain due to dental caries    Pain from caries of tooth #31. Old records reviewed and she has a prior ED visit about 5 months ago for painful caries of tooth #14. She is referred to the on-call dentist for definitive care. She is given prescription for amoxicillin and oxycodone-acetaminophen and is advised to use ibuprofen and acetaminophen for less severe pain.   I personally performed the services described in this documentation, which was scribed in my presence. The recorded information has been reviewed and is accurate.       Dione Boozeavid Marshawn Ninneman, MD 08/16/14 [phone]

## 2014-08-16 NOTE — ED Notes (Signed)
C/o R lower toothache x 3 days.

## 2014-08-16 NOTE — Discharge Instructions (Signed)
Dental Caries °Dental caries (also called tooth decay) is the most common oral disease. It can occur at any age but is more common in children and young adults.  °HOW DENTAL CARIES DEVELOPS  °The process of decay begins when bacteria and foods (particularly sugars and starches) combine in your mouth to produce plaque. Plaque is a substance that sticks to the hard, outer surface of a tooth (enamel). The bacteria in plaque produce acids that attack enamel. These acids may also attack the root surface of a tooth (cementum) if it is exposed. Repeated attacks dissolve these surfaces and create holes in the tooth (cavities). If left untreated, the acids destroy the other layers of the tooth.  °RISK FACTORS °· Frequent sipping of sugary beverages.   °· Frequent snacking on sugary and starchy foods, especially those that easily get stuck in the teeth.   °· Poor oral hygiene.   °· Dry mouth.   °· Substance abuse such as methamphetamine abuse.   °· Broken or poor-fitting dental restorations.   °· Eating disorders.   °· Gastroesophageal reflux disease (GERD).   °· Certain radiation treatments to the head and neck. °SYMPTOMS °In the early stages of dental caries, symptoms are seldom present. Sometimes white, chalky areas may be seen on the enamel or other tooth layers. In later stages, symptoms may include: °· Pits and holes on the enamel. °· Toothache after sweet, hot, or cold foods or drinks are consumed. °· Pain around the tooth. °· Swelling around the tooth. °DIAGNOSIS  °Most of the time, dental caries is detected during a regular dental checkup. A diagnosis is made after a thorough medical and dental history is taken and the surfaces of your teeth are checked for signs of dental caries. Sometimes special instruments, such as lasers, are used to check for dental caries. Dental X-ray exams may be taken so that areas not visible to the eye (such as between the contact areas of the teeth) can be checked for cavities.    °TREATMENT  °If dental caries is in its early stages, it may be reversed with a fluoride treatment or an application of a remineralizing agent at the dental office. Thorough brushing and flossing at home is needed to aid these treatments. If it is in its later stages, treatment depends on the location and extent of tooth destruction:  °· If a small area of the tooth has been destroyed, the destroyed area will be removed and cavities will be filled with a material such as gold, silver amalgam, or composite resin.   °· If a large area of the tooth has been destroyed, the destroyed area will be removed and a cap (crown) will be fitted over the remaining tooth structure.   °· If the center part of the tooth (pulp) is affected, a procedure called a root canal will be needed before a filling or crown can be placed.   °· If most of the tooth has been destroyed, the tooth may need to be pulled (extracted). °HOME CARE INSTRUCTIONS °You can prevent, stop, or reverse dental caries at home by practicing good oral hygiene. Good oral hygiene includes: °· Thoroughly cleaning your teeth at least twice a day with a toothbrush and dental floss.   °· Using a fluoride toothpaste. A fluoride mouth rinse may also be used if recommended by your dentist or health care provider.   °· Restricting the amount of sugary and starchy foods and sugary liquids you consume.   °· Avoiding frequent snacking on these foods and sipping of these liquids.   °· Keeping regular visits with   a dentist for checkups and cleanings. °PREVENTION  °· Practice good oral hygiene. °· Consider a dental sealant. A dental sealant is a coating material that is applied by your dentist to the pits and grooves of teeth. The sealant prevents food from being trapped in them. It may protect the teeth for several years. °· Ask about fluoride supplements if you live in a community without fluorinated water or with water that has a low fluoride content. Use fluoride supplements  as directed by your dentist or health care provider. °· Allow fluoride varnish applications to teeth if directed by your dentist or health care provider. °Document Released: 12/27/2001 Document Revised: 08/21/2013 Document Reviewed: 04/08/2012 °ExitCare® Patient Information ©2015 [employer]. This information is not intended to replace advice given to you by your health care provider. Make sure you discuss any questions you have with your health care provider. ° °Amoxicillin capsules or tablets °What is this medicine? °AMOXICILLIN (a mox i SIL in) is a penicillin antibiotic. It is used to treat certain kinds of bacterial infections. It will not work for colds, flu, or other viral infections. °This medicine may be used for other purposes; ask your health care provider or pharmacist if you have questions. °COMMON BRAND NAME(S): Amoxil, Moxilin, Sumox, Trimox °What should I tell my health care provider before I take this medicine? °They need to know if you have any of these conditions: °-asthma °-kidney disease °-an unusual or allergic reaction to amoxicillin, other penicillins, cephalosporin antibiotics, other medicines, foods, dyes, or preservatives °-pregnant or trying to get pregnant °-breast-feeding °How should I use this medicine? °Take this medicine by mouth with a glass of water. Follow the directions on your prescription label. You may take this medicine with food or on an empty stomach. Take your medicine at regular intervals. Do not take your medicine more often than directed. Take all of your medicine as directed even if you think your are better. Do not skip doses or stop your medicine early. °Talk to your pediatrician regarding the use of this medicine in children. While this drug may be prescribed for selected conditions, precautions do apply. °Overdosage: If you think you have taken too much of this medicine contact a poison control center or emergency room at once. °NOTE: This medicine is only for  you. Do not share this medicine with others. °What if I miss a dose? °If you miss a dose, take it as soon as you can. If it is almost time for your next dose, take only that dose. Do not take double or extra doses. °What may interact with this medicine? °-amiloride °-birth control pills °-chloramphenicol °-macrolides °-probenecid °-sulfonamides °-tetracyclines °This list may not describe all possible interactions. Give your health care provider a list of all the medicines, herbs, non-prescription drugs, or dietary supplements you use. Also tell them if you smoke, drink alcohol, or use illegal drugs. Some items may interact with your medicine. °What should I watch for while using this medicine? °Tell your doctor or health care professional if your symptoms do not improve in 2 or 3 days. Take all of the doses of your medicine as directed. Do not skip doses or stop your medicine early. °If you are diabetic, you may get a false positive result for sugar in your urine with certain brands of urine tests. Check with your doctor. °Do not treat diarrhea with over-the-counter products. Contact your doctor if you have diarrhea that lasts more than 2 days or if the diarrhea is severe and   watery. °What side effects may I notice from receiving this medicine? °Side effects that you should report to your doctor or health care professional as soon as possible: °-allergic reactions like skin rash, itching or hives, swelling of the face, lips, or tongue °-breathing problems °-dark urine °-redness, blistering, peeling or loosening of the skin, including inside the mouth °-seizures °-severe or watery diarrhea °-trouble passing urine or change in the amount of urine °-unusual bleeding or bruising °-unusually weak or tired °-yellowing of the eyes or skin °Side effects that usually do not require medical attention (report to your doctor or health care professional if they continue or are bothersome): °-dizziness °-headache °-stomach  upset °-trouble sleeping °This list may not describe all possible side effects. Call your doctor for medical advice about side effects. You may report side effects to FDA at 1-800-FDA-1088. °Where should I keep my medicine? °Keep out of the reach of children. °Store between 68 and 77 degrees F (20 and 25 degrees C). Keep bottle closed tightly. Throw away any unused medicine after the expiration date. °NOTE: This sheet is a summary. It may not cover all possible information. If you have questions about this medicine, talk to your doctor, pharmacist, or health care provider. °© 2015, Elsevier/[employer] Standard. (2007-06-28 14:10:59) ° °Acetaminophen; Oxycodone tablets °What is this medicine? °ACETAMINOPHEN; OXYCODONE (a set a MEE noe fen; ox i KOE done) is a pain reliever. It is used to treat mild to moderate pain. °This medicine may be used for other purposes; ask your health care provider or pharmacist if you have questions. °COMMON BRAND NAME(S): Endocet, Magnacet, Narvox, Percocet, Perloxx, Primalev, Primlev, Roxicet, Xolox °What should I tell my health care provider before I take this medicine? °They need to know if you have any of these conditions: °-brain tumor °-Crohn's disease, inflammatory bowel disease, or ulcerative colitis °-drug abuse or addiction °-head injury °-heart or circulation problems °-if you often drink alcohol °-kidney disease or problems going to the bathroom °-liver disease °-lung disease, asthma, or breathing problems °-an unusual or allergic reaction to acetaminophen, oxycodone, other opioid analgesics, other medicines, foods, dyes, or preservatives °-pregnant or trying to get pregnant °-breast-feeding °How should I use this medicine? °Take this medicine by mouth with a full glass of water. Follow the directions on the prescription label. Take your medicine at regular intervals. Do not take your medicine more often than directed. °Talk to your pediatrician regarding the use of this medicine in  children. Special care may be needed. °Patients over 65 years old may have a stronger reaction and need a smaller dose. °Overdosage: If you think you have taken too much of this medicine contact a poison control center or emergency room at once. °NOTE: This medicine is only for you. Do not share this medicine with others. °What if I miss a dose? °If you miss a dose, take it as soon as you can. If it is almost time for your next dose, take only that dose. Do not take double or extra doses. °What may interact with this medicine? °-alcohol °-antihistamines °-barbiturates like amobarbital, butalbital, butabarbital, methohexital, pentobarbital, phenobarbital, thiopental, and secobarbital °-benztropine °-drugs for bladder problems like solifenacin, trospium, oxybutynin, tolterodine, hyoscyamine, and methscopolamine °-drugs for breathing problems like ipratropium and tiotropium °-drugs for certain stomach or intestine problems like propantheline, homatropine methylbromide, glycopyrrolate, atropine, belladonna, and dicyclomine °-general anesthetics like etomidate, ketamine, nitrous oxide, propofol, desflurane, enflurane, halothane, isoflurane, and sevoflurane °-medicines for depression, anxiety, or psychotic disturbances °-medicines for sleep °-muscle relaxants °-naltrexone °-  narcotic medicines (opiates) for pain °-phenothiazines like perphenazine, thioridazine, chlorpromazine, mesoridazine, fluphenazine, prochlorperazine, promazine, and trifluoperazine °-scopolamine °-tramadol °-trihexyphenidyl °This list may not describe all possible interactions. Give your health care provider a list of all the medicines, herbs, non-prescription drugs, or dietary supplements you use. Also tell them if you smoke, drink alcohol, or use illegal drugs. Some items may interact with your medicine. °What should I watch for while using this medicine? °Tell your doctor or health care professional if your pain does not go away, if it gets worse,  or if you have new or a different type of pain. You may develop tolerance to the medicine. Tolerance means that you will need a higher dose of the medication for pain relief. Tolerance is normal and is expected if you take this medicine for a long time. °Do not suddenly stop taking your medicine because you may develop a severe reaction. Your body becomes used to the medicine. This does NOT mean you are addicted. Addiction is a behavior related to getting and using a drug for a non-medical reason. If you have pain, you have a medical reason to take pain medicine. Your doctor will tell you how much medicine to take. If your doctor wants you to stop the medicine, the dose will be slowly lowered over time to avoid any side effects. °You may get drowsy or dizzy. Do not drive, use machinery, or do anything that needs mental alertness until you know how this medicine affects you. Do not stand or sit up quickly, especially if you are an older patient. This reduces the risk of dizzy or fainting spells. Alcohol may interfere with the effect of this medicine. Avoid alcoholic drinks. °There are different types of narcotic medicines (opiates) for pain. If you take more than one type at the same time, you may have more side effects. Give your health care provider a list of all medicines you use. Your doctor will tell you how much medicine to take. Do not take more medicine than directed. Call emergency for help if you have problems breathing. °The medicine will cause constipation. Try to have a bowel movement at least every 2 to 3 days. If you do not have a bowel movement for 3 days, call your doctor or health care professional. °Do not take Tylenol (acetaminophen) or medicines that have acetaminophen with this medicine. Too much acetaminophen can be very dangerous. Many nonprescription medicines contain acetaminophen. Always read the labels carefully to avoid taking more acetaminophen. °What side effects may I notice from  receiving this medicine? °Side effects that you should report to your doctor or health care professional as soon as possible: °-allergic reactions like skin rash, itching or hives, swelling of the face, lips, or tongue °-breathing difficulties, wheezing °-confusion °-light headedness or fainting spells °-severe stomach pain °-unusually weak or tired °-yellowing of the skin or the whites of the eyes °Side effects that usually do not require medical attention (report to your doctor or health care professional if they continue or are bothersome): °-dizziness °-drowsiness °-nausea °-vomiting °This list may not describe all possible side effects. Call your doctor for medical advice about side effects. You may report side effects to FDA at 1-800-FDA-1088. °Where should I keep my medicine? °Keep out of the reach of children. This medicine can be abused. Keep your medicine in a safe place to protect it from theft. Do not share this medicine with anyone. Selling or giving away this medicine is dangerous and against the law. °Store at   room temperature between 20 and 25 degrees C (68 and 77 degrees F). Keep container tightly closed. Protect from light. °This medicine may cause accidental overdose and death if it is taken by other adults, children, or pets. Flush any unused medicine down the toilet to reduce the chance of harm. Do not use the medicine after the expiration date. °NOTE: This sheet is a summary. It may not cover all possible information. If you have questions about this medicine, talk to your doctor, pharmacist, or health care provider. °© 2015, Elsevier/Gold Standard. (2012-11-28 13:17:35) ° ° °Emergency Department Resource Guide ° °Dental Care: °Organization         Address  Phone  Notes  °[facility] [address], [place] [phone] Accepts children up to age 21 who are enrolled in Medicaid or [place] Health Choice; pregnant women with a  Medicaid card; and children who have applied for Medicaid or [place] Health Choice, but were declined, whose parents can pay a reduced fee at time of service.  °[facility] [place]  [address], [place] [phone] Accepts children up to age 21 who are enrolled in Medicaid or [place] Health Choice; pregnant women with a Medicaid card; and children who have applied for Medicaid or [place] Health Choice, but were declined, whose parents can pay a reduced fee at time of service.  °[facility] ° [address], [place] [phone] Patients are seen by appointment only. Walk-ins are not accepted. [facility] will see patients 18 years of age and older. °Monday - Tuesday (8am-5pm) °Most Wednesdays (8:30-5pm) °$30 per visit, cash only  °[facility] ° [address], [place] [phone] Patients are seen by appointment only. Walk-ins are not accepted. [facility] will see patients 18 years of age and older. °One Wednesday Evening (Monthly: Volunteer Based).  $30 per visit, cash only  °[facility]  [phone] for adults; Children under age 4, call Graduate Pediatric Dentistry at [phone]. Children aged 4-14, please call [phone] to request a pediatric application. ° Dental services are provided in all areas of dental care including fillings, crowns and bridges, complete and partial dentures, implants, gum treatment, root canals, and extractions. Preventive care is also provided. Treatment is provided to both adults and children. °Patients are selected via a lottery and there is often a waiting list. °  °[facility] [address], ° ° [phone] [email or web address] °  °[facility] [address], [place], [place] [phone], Ext. [phone] Second and Fourth Thursday of each month, opens at 6:30 AM; Clinic ends at 9 AM.  Patients are seen on a  first-come first-served basis, and a limited number are seen during each clinic.  ° °[facility] ° [address], [place], [place] [phone]   Eligibility Requirements °You must have lived in [place], [place], or [place] counties for at least the last three months. °  You cannot be eligible for state or federal sponsored healthcare insurance, including Veterans Administration, Medicaid, or Medicare. °  You generally cannot be eligible for healthcare insurance through your employer.  °  How to apply: °Eligibility screenings are held every Tuesday and Wednesday afternoon from 1:00 pm until 4:00 pm. You do not need an appointment for the interview!  °[facility] [address], [place], [place]   [phone]   °[facility]  [phone]   °[facility]  [phone]   °[facility]  [phone]   ° °

## 2014-09-26 ENCOUNTER — Other Ambulatory Visit: Payer: Self-pay | Admitting: Gynecology

## 2014-09-27 LAB — CYTOLOGY - PAP

## 2015-02-13 ENCOUNTER — Emergency Department (HOSPITAL_COMMUNITY): Payer: BLUE CROSS/BLUE SHIELD

## 2015-02-13 ENCOUNTER — Emergency Department (HOSPITAL_COMMUNITY)
Admission: EM | Admit: 2015-02-13 | Discharge: 2015-02-13 | Disposition: A | Discharge status: Home / Self Care | Payer: BLUE CROSS/BLUE SHIELD | Attending: Emergency Medicine | Admitting: Emergency Medicine

## 2015-02-13 ENCOUNTER — Encounter (HOSPITAL_COMMUNITY): Payer: Self-pay

## 2015-02-13 DIAGNOSIS — F319 Bipolar disorder, unspecified: Secondary | ICD-10-CM | POA: Diagnosis not present

## 2015-02-13 DIAGNOSIS — I10 Essential (primary) hypertension: Secondary | ICD-10-CM | POA: Insufficient documentation

## 2015-02-13 DIAGNOSIS — Y9289 Other specified places as the place of occurrence of the external cause: Secondary | ICD-10-CM | POA: Diagnosis not present

## 2015-02-13 DIAGNOSIS — Y998 Other external cause status: Secondary | ICD-10-CM | POA: Diagnosis not present

## 2015-02-13 DIAGNOSIS — Z792 Long term (current) use of antibiotics: Secondary | ICD-10-CM | POA: Diagnosis not present

## 2015-02-13 DIAGNOSIS — S79912A Unspecified injury of left hip, initial encounter: Secondary | ICD-10-CM | POA: Insufficient documentation

## 2015-02-13 DIAGNOSIS — S8992XA Unspecified injury of left lower leg, initial encounter: Secondary | ICD-10-CM | POA: Diagnosis present

## 2015-02-13 DIAGNOSIS — Z720 Tobacco use: Secondary | ICD-10-CM | POA: Diagnosis not present

## 2015-02-13 DIAGNOSIS — E7800 Pure hypercholesterolemia, unspecified: Secondary | ICD-10-CM | POA: Insufficient documentation

## 2015-02-13 DIAGNOSIS — G8929 Other chronic pain: Secondary | ICD-10-CM | POA: Diagnosis not present

## 2015-02-13 DIAGNOSIS — W010XXA Fall on same level from slipping, tripping and stumbling without subsequent striking against object, initial encounter: Secondary | ICD-10-CM | POA: Insufficient documentation

## 2015-02-13 DIAGNOSIS — M1712 Unilateral primary osteoarthritis, left knee: Secondary | ICD-10-CM | POA: Diagnosis not present

## 2015-02-13 DIAGNOSIS — Z79899 Other long term (current) drug therapy: Secondary | ICD-10-CM | POA: Insufficient documentation

## 2015-02-13 DIAGNOSIS — Y9389 Activity, other specified: Secondary | ICD-10-CM | POA: Insufficient documentation

## 2015-02-13 MED ORDER — IBUPROFEN 600 MG PO TABS: 600.0000 mg | ORAL_TABLET | Freq: Four times a day (QID) | ORAL | Status: DC | PRN

## 2015-02-13 MED ORDER — TRAMADOL HCL 50 MG PO TABS: 50.0000 mg | ORAL_TABLET | Freq: Four times a day (QID) | ORAL | Status: DC | PRN

## 2015-02-13 MED ORDER — HYDROCODONE-ACETAMINOPHEN 5-325 MG PO TABS
1.0000 | ORAL_TABLET | Freq: Once | ORAL | Status: AC
  Administered 2015-02-13: 1 via ORAL
  Filled 2015-02-13: qty 1

## 2015-02-13 NOTE — ED Notes (Addendum)
Pt has previous hx of knee problems/chronic pain.  However, pt feel last night onto left knee.  Contact made with concrete.  Pt states her knee "just gave out".  Pt with swelling noted and slight abrasion.  Using ice, heat and BC powders without relief.  Difficulty with ambulation

## 2015-02-13 NOTE — ED Provider Notes (Signed)
CSN: [id number]     Arrival date & time 02/13/15  4098 History   First MD Initiated Contact with Patient 02/13/15 [phone]     Chief Complaint  Patient presents with  . Knee Injury   HPI Comments: Patient has history of chronic knee trouble. However, last night she fell onto the concrete after tripping and stumbling. Patient has tried ice heat BC powders without relief. Her knee continues to hurt. She also has some pain in the left hip area. He denies any headache or loss of consciousness. No numbness or weakness.  Patient is a 44 y.o. female presenting with knee pain. The history is provided by the patient.  Knee Pain Pain details:    Quality:  Sharp   Radiates to: Left hip.   Severity:  Moderate   Onset quality:  Sudden   Duration:  1 day   Timing:  Constant Chronicity:  Recurrent Relieved by:  Nothing Worsened by:  Activity   Past Medical History  Diagnosis Date  . Hypertension   . Depression   . Bipolar 1 disorder (HCC)   . High cholesterol    Past Surgical History  Procedure Laterality Date  . Chieloplasty    . Palatoplasty    . Abdominal hysterectomy    . Tubal ligation     Family History  Problem Relation Age of Onset  . CAD Mother   . CAD Father   . CAD Brother   . Stroke Brother    Social History  Substance Use Topics  . Smoking status: Current Every Day Smoker -- 0.25 packs/day for 20 years    Types: Cigarettes  . Smokeless tobacco: Never Used  . Alcohol Use: Yes     Comment: occasional   OB History    No data available     Review of Systems  All other systems reviewed and are negative.     Allergies  Review of patient's allergies indicates no known allergies.  Home Medications   Prior to Admission medications   Medication Sig Start Date End Date Taking? Authorizing Provider  amLODipine (NORVASC) 10 MG tablet Take 1 tablet (10 mg total) by mouth daily. 05/01/14   Leana Roe Elgergawy, MD  amoxicillin (AMOXIL) 500 MG capsule Take 2 capsules  (1,000 mg total) by mouth 2 (two) times daily. 08/16/14   Dione Booze, MD  aspirin 81 MG tablet Take 1 tablet (81 mg total) by mouth daily. 05/01/14   Leana Roe Elgergawy, MD  atorvastatin (LIPITOR) 10 MG tablet Take 10 mg by mouth daily.    Historical Provider, MD  FLUoxetine (PROZAC) 40 MG capsule Take 40 mg by mouth daily.    Historical Provider, MD  HYDROcodone-acetaminophen (NORCO/VICODIN) 5-325 MG per tablet Take 1-2 tablets by mouth every 6 (six) hours as needed for moderate pain or severe pain. 05/01/14   Leana Roe Elgergawy, MD  ibuprofen (ADVIL,MOTRIN) 600 MG tablet Take 1 tablet (600 mg total) by mouth every 6 (six) hours as needed. 02/13/15   Linwood Dibbles, MD  lisinopril (PRINIVIL,ZESTRIL) 5 MG tablet Take 5 mg by mouth daily.    Historical Provider, MD  LITHIUM CARBONATE ER PO Take 300 mg by mouth 2 (two) times daily.    Historical Provider, MD  oxyCODONE-acetaminophen (PERCOCET) 5-325 MG per tablet Take 1 tablet by mouth every 4 (four) hours as needed for moderate pain. 08/16/14   Dione Booze, MD  penicillin v potassium (VEETID) 500 MG tablet Take 1 tablet (500 mg total) by mouth 3 (  three) times daily. 04/06/14   Hannah Muthersbaugh, PA-C  senna-docusate (SENOKOT-S) 8.6-50 MG per tablet Take 1 tablet by mouth daily. 05/01/14   Leana Roeawood S Elgergawy, MD  traMADol (ULTRAM) 50 MG tablet Take 1 tablet (50 mg total) by mouth every 6 (six) hours as needed. 02/13/15   Linwood DibblesJon Chad Donoghue, MD   BP 157/98 mmHg  Pulse 73  Temp(Src) 97.7 F (36.5 C) (Oral)  Resp 16  SpO2 100% Physical Exam  Constitutional: She appears well-developed and well-nourished. No distress.  HENT:  Head: Normocephalic and atraumatic.  Right Ear: External ear normal.  Left Ear: External ear normal.  Eyes: Conjunctivae are normal. Right eye exhibits no discharge. Left eye exhibits no discharge. No scleral icterus.  Neck: Neck supple. No tracheal deviation present.  Cardiovascular: Normal rate.   Pulmonary/Chest: Effort normal. No  stridor. No respiratory distress.  Musculoskeletal: She exhibits no edema.  Neurological: She is alert. Cranial nerve deficit: no gross deficits.  Skin: Skin is warm and dry. No rash noted.  Psychiatric: She has a normal mood and affect.  Nursing note and vitals reviewed.   ED Course  Procedures  Imaging Review Dg Knee Complete 4 Views Left  02/13/2015  CLINICAL DATA:  44 year old female who fell last night onto the left knee with pain and unable to weightbear. Initial encounter. EXAM: LEFT KNEE - COMPLETE 4+ VIEW COMPARISON:  Left knee radiographs 12/31/2003.  MRI 05/16/2010. FINDINGS: Progressed tricompartmental joint space loss and degenerative spurring since 2005. Small suprapatellar joint effusion. Patella appears intact. No acute fracture or dislocation. Stable small benign appearing sclerotic focus in the distal left femur medullary space. IMPRESSION: Severe tricompartmental degenerative changes. No acute fracture or dislocation identified about the left knee. Electronically Signed   By: Odessa FlemingH  Hall M.D.   On: 02/13/2015 10:04   Dg Hip Unilat With Pelvis 2-3 Views Left  02/13/2015  CLINICAL DATA:  44 year old female who fell last night with hip pain and unable to weightbear. Initial encounter. EXAM: DG HIP (WITH OR WITHOUT PELVIS) 2-3V LEFT COMPARISON:  CT Abdomen and Pelvis 04/30/2014. FINDINGS: Femoral heads are normally located. Pelvis intact. Proximal right femur appears intact. sacral ala and SI joints within normal limits. Proximal left femur intact. IMPRESSION: No acute fracture or dislocation identified about the left hip or pelvis. Electronically Signed   By: Odessa FlemingH  Hall M.D.   On: 02/13/2015 10:11   I have personally reviewed and evaluated these images results as part of my medical decision-making.    MDM   Final diagnoses:  Osteoarthritis of left knee, unspecified osteoarthritis type    The x-ray shows osteoarthritis without evidence of fracture. The patient's symptoms were  related to the arthritis in her knee. I doubt any acute or occult fracture.  Discharge home with pain medications. Follow-up with orthopedist or primary doctor.    Linwood DibblesJon Shateka Petrea, MD 02/13/15 [id number]

## 2015-02-13 NOTE — Discharge Instructions (Signed)

## 2015-05-13 ENCOUNTER — Encounter (HOSPITAL_BASED_OUTPATIENT_CLINIC_OR_DEPARTMENT_OTHER): Payer: Self-pay

## 2015-05-13 ENCOUNTER — Emergency Department (HOSPITAL_BASED_OUTPATIENT_CLINIC_OR_DEPARTMENT_OTHER)
Admission: EM | Admit: 2015-05-13 | Discharge: 2015-05-14 | Disposition: A | Discharge status: Home / Self Care | Payer: BLUE CROSS/BLUE SHIELD | Attending: Emergency Medicine | Admitting: Emergency Medicine

## 2015-05-13 ENCOUNTER — Emergency Department (HOSPITAL_BASED_OUTPATIENT_CLINIC_OR_DEPARTMENT_OTHER): Payer: BLUE CROSS/BLUE SHIELD

## 2015-05-13 DIAGNOSIS — Z3202 Encounter for pregnancy test, result negative: Secondary | ICD-10-CM | POA: Diagnosis not present

## 2015-05-13 DIAGNOSIS — I10 Essential (primary) hypertension: Secondary | ICD-10-CM | POA: Diagnosis not present

## 2015-05-13 DIAGNOSIS — K5900 Constipation, unspecified: Secondary | ICD-10-CM | POA: Insufficient documentation

## 2015-05-13 DIAGNOSIS — Z7982 Long term (current) use of aspirin: Secondary | ICD-10-CM | POA: Insufficient documentation

## 2015-05-13 DIAGNOSIS — R1110 Vomiting, unspecified: Secondary | ICD-10-CM | POA: Insufficient documentation

## 2015-05-13 DIAGNOSIS — R1012 Left upper quadrant pain: Secondary | ICD-10-CM | POA: Diagnosis not present

## 2015-05-13 DIAGNOSIS — R1032 Left lower quadrant pain: Secondary | ICD-10-CM | POA: Insufficient documentation

## 2015-05-13 DIAGNOSIS — Z79899 Other long term (current) drug therapy: Secondary | ICD-10-CM | POA: Insufficient documentation

## 2015-05-13 DIAGNOSIS — F329 Major depressive disorder, single episode, unspecified: Secondary | ICD-10-CM | POA: Diagnosis not present

## 2015-05-13 DIAGNOSIS — Z862 Personal history of diseases of the blood and blood-forming organs and certain disorders involving the immune mechanism: Secondary | ICD-10-CM | POA: Insufficient documentation

## 2015-05-13 DIAGNOSIS — E7800 Pure hypercholesterolemia, unspecified: Secondary | ICD-10-CM | POA: Insufficient documentation

## 2015-05-13 DIAGNOSIS — R109 Unspecified abdominal pain: Secondary | ICD-10-CM

## 2015-05-13 DIAGNOSIS — Z9851 Tubal ligation status: Secondary | ICD-10-CM | POA: Diagnosis not present

## 2015-05-13 DIAGNOSIS — F17210 Nicotine dependence, cigarettes, uncomplicated: Secondary | ICD-10-CM | POA: Insufficient documentation

## 2015-05-13 DIAGNOSIS — Z90710 Acquired absence of both cervix and uterus: Secondary | ICD-10-CM | POA: Diagnosis not present

## 2015-05-13 HISTORY — DX: Infarction of spleen: D73.5

## 2015-05-13 LAB — COMPREHENSIVE METABOLIC PANEL
ALT: 21 U/L (ref 14–54)
AST: 22 U/L (ref 15–41)
Albumin: 4.4 g/dL (ref 3.5–5.0)
Alkaline Phosphatase: 59 U/L (ref 38–126)
Anion gap: 8 (ref 5–15)
BILIRUBIN TOTAL: 0.7 mg/dL (ref 0.3–1.2)
BUN: 10 mg/dL (ref 6–20)
CHLORIDE: 107 mmol/L (ref 101–111)
CO2: 24 mmol/L (ref 22–32)
CREATININE: 0.82 mg/dL (ref 0.44–1.00)
Calcium: 9 mg/dL (ref 8.9–10.3)
Glucose, Bld: 141 mg/dL — ABNORMAL HIGH (ref 65–99)
POTASSIUM: 3.3 mmol/L — AB (ref 3.5–5.1)
Sodium: 139 mmol/L (ref 135–145)
TOTAL PROTEIN: 7.4 g/dL (ref 6.5–8.1)

## 2015-05-13 LAB — URINALYSIS, ROUTINE W REFLEX MICROSCOPIC
BILIRUBIN URINE: NEGATIVE
Glucose, UA: NEGATIVE mg/dL
HGB URINE DIPSTICK: NEGATIVE
KETONES UR: NEGATIVE mg/dL
Leukocytes, UA: NEGATIVE
NITRITE: NEGATIVE
Protein, ur: NEGATIVE mg/dL
SPECIFIC GRAVITY, URINE: 1.022 (ref 1.005–1.030)
pH: 6.5 (ref 5.0–8.0)

## 2015-05-13 LAB — CBC
HCT: 36.4 % (ref 36.0–46.0)
Hemoglobin: 12.1 g/dL (ref 12.0–15.0)
MCH: 35.6 pg — ABNORMAL HIGH (ref 26.0–34.0)
MCHC: 33.2 g/dL (ref 30.0–36.0)
MCV: 107.1 fL — ABNORMAL HIGH (ref 78.0–100.0)
PLATELETS: 306 10*3/uL (ref 150–400)
RBC: 3.4 MIL/uL — AB (ref 3.87–5.11)
RDW: 11.3 % — AB (ref 11.5–15.5)
WBC: 10.7 10*3/uL — AB (ref 4.0–10.5)

## 2015-05-13 LAB — LIPASE, BLOOD: Lipase: 38 U/L (ref 11–51)

## 2015-05-13 LAB — PREGNANCY, URINE: PREG TEST UR: NEGATIVE

## 2015-05-13 MED ORDER — MORPHINE SULFATE (PF) 4 MG/ML IV SOLN
4.0000 mg | Freq: Once | INTRAVENOUS | Status: AC
  Administered 2015-05-13: 4 mg via INTRAVENOUS
  Filled 2015-05-13: qty 1

## 2015-05-13 MED ORDER — ONDANSETRON HCL 4 MG/2ML IJ SOLN
4.0000 mg | Freq: Once | INTRAMUSCULAR | Status: AC
  Administered 2015-05-13: 4 mg via INTRAVENOUS
  Filled 2015-05-13: qty 2

## 2015-05-13 NOTE — ED Notes (Signed)
Left side pain that radiates to her back that started Friday with one episode of vomiting, no dysuria, no fevers, states it feels the same as when she had a splenic infarct, VSS, pt ambulating without issue and no acute distress in triage.

## 2015-05-13 NOTE — ED Provider Notes (Signed)
CSN: [id number]     Arrival date & time 05/13/15  2026 History  By signing my name below, I, Murriel Hopper, attest that this documentation has been prepared under the direction and in the presence of Geoffery Lyons, MD. Electronically Signed: Murriel Hopper, ED Scribe. 05/13/2015. 11:23 PM.    Chief Complaint  Patient presents with  . Abdominal Pain     Patient is a 45 y.o. female presenting with abdominal pain. The history is provided by the patient. No language interpreter was used.  Abdominal Pain Associated symptoms: constipation and vomiting   Associated symptoms: no dysuria and no hematuria    HPI Comments: Miranda Bernard is a 45 y.o. female with a hx of Splenic Infarct, HTN who presents to the Emergency Department complaining of constant, worsening left-sided abdominal pain that radiates to her back that has been present for three days. Pt states she saw her PCP today for the same symptoms, and was recommended to come to ED. Pt states her pain is the same as when she had a splenic infarct. Pt also reports associated constipation, intermittent vomiting. Pt states her last bowel movement was yesterday and it was very dark-colored. Pt states she has never had a colonoscopy or diverticulitis. Pt denies dysuria, hematuria.     Past Medical History  Diagnosis Date  . Hypertension   . Depression   . Bipolar 1 disorder (HCC)   . High cholesterol   . Splenic infarct    Past Surgical History  Procedure Laterality Date  . Chieloplasty    . Palatoplasty    . Abdominal hysterectomy    . Tubal ligation     Family History  Problem Relation Age of Onset  . CAD Mother   . CAD Father   . CAD Brother   . Stroke Brother    Social History  Substance Use Topics  . Smoking status: Current Every Day Smoker -- 0.25 packs/day for 20 years    Types: Cigarettes  . Smokeless tobacco: Never Used  . Alcohol Use: Yes     Comment: occasional   OB History    No data available     Review of  Systems  Gastrointestinal: Positive for vomiting, abdominal pain and constipation.  Genitourinary: Negative for dysuria and hematuria.  All other systems reviewed and are negative.     Allergies  Frovatriptan; Imitrex; and Wellbutrin  Home Medications   Prior to Admission medications   Medication Sig Start Date End Date Taking? Authorizing Provider  amLODipine (NORVASC) 10 MG tablet Take 1 tablet (10 mg total) by mouth daily. Patient not taking: Reported on 02/13/2015 05/01/14   Leana Roe Elgergawy, MD  amoxicillin (AMOXIL) 500 MG capsule Take 2 capsules (1,000 mg total) by mouth 2 (two) times daily. Patient not taking: Reported on 02/13/2015 08/16/14   Dione Booze, MD  aspirin 81 MG tablet Take 1 tablet (81 mg total) by mouth daily. Patient not taking: Reported on 02/13/2015 05/01/14   Leana Roe Elgergawy, MD  Aspirin-Caffeine [phone] MG PACK Take 1 packet by mouth every 6 (six) hours as needed (For pain.).    Historical Provider, MD  atorvastatin (LIPITOR) 10 MG tablet Take 10 mg by mouth daily.    Historical Provider, MD  HYDROcodone-acetaminophen (NORCO/VICODIN) 5-325 MG per tablet Take 1-2 tablets by mouth every 6 (six) hours as needed for moderate pain or severe pain. Patient not taking: Reported on 02/13/2015 05/01/14   Leana Roe Elgergawy, MD  ibuprofen (ADVIL,MOTRIN) 600 MG tablet Take  1 tablet (600 mg total) by mouth every 6 (six) hours as needed. 02/13/15   Linwood Dibbles, MD  lisinopril (PRINIVIL,ZESTRIL) 5 MG tablet Take 5 mg by mouth daily.    Historical Provider, MD  lithium 300 MG tablet Take 300 mg by mouth 2 (two) times daily.    Historical Provider, MD  oxyCODONE-acetaminophen (PERCOCET) 5-325 MG per tablet Take 1 tablet by mouth every 4 (four) hours as needed for moderate pain. Patient not taking: Reported on 02/13/2015 08/16/14   Dione Booze, MD  penicillin v potassium (VEETID) 500 MG tablet Take 1 tablet (500 mg total) by mouth 3 (three) times daily. Patient not taking:  Reported on 02/13/2015 04/06/14   Dahlia Client Muthersbaugh, PA-C  senna-docusate (SENOKOT-S) 8.6-50 MG per tablet Take 1 tablet by mouth daily. Patient not taking: Reported on 02/13/2015 05/01/14   Leana Roe Elgergawy, MD  traMADol (ULTRAM) 50 MG tablet Take 1 tablet (50 mg total) by mouth every 6 (six) hours as needed. Patient not taking: Reported on 05/13/2015 02/13/15   Linwood Dibbles, MD  zolpidem (AMBIEN) 5 MG tablet Take 5 mg by mouth at bedtime as needed for sleep.  03/21/14 03/21/15  Historical Provider, MD   BP 150/86 mmHg  Pulse 78  Temp(Src) 98.1 F (36.7 C) (Oral)  Resp 16  Ht  (1.6 m)  Wt 175 lb (79.379 kg)  BMI 31.01 kg/m2  SpO2 100% Physical Exam  Constitutional: She is oriented to person, place, and time. She appears well-developed and well-nourished. No distress.  HENT:  Head: Normocephalic and atraumatic.  Eyes: EOM are normal.  Neck: Normal range of motion.  Cardiovascular: Normal rate, regular rhythm and normal heart sounds.   Pulmonary/Chest: Effort normal and breath sounds normal.  Abdominal: Soft. She exhibits no distension. There is tenderness. There is no rebound and no guarding.  TTP in the left flank, LUQ, and LLQ  Musculoskeletal: Normal range of motion.  Neurological: She is alert and oriented to person, place, and time.  Skin: Skin is warm and dry.  Psychiatric: She has a normal mood and affect. Judgment normal.  Nursing note and vitals reviewed.   ED Course  Procedures (including critical care time)  DIAGNOSTIC STUDIES: Oxygen Saturation is 100% on room air, normal by my interpretation.    COORDINATION OF CARE: 11:17 PM Discussed treatment plan with pt at bedside and pt agreed to plan.   Labs Review Labs Reviewed  COMPREHENSIVE METABOLIC PANEL - Abnormal; Notable for the following:    Potassium 3.3 (*)    Glucose, Bld 141 (*)    All other components within normal limits  URINALYSIS, ROUTINE W REFLEX MICROSCOPIC (NOT AT [facility])  PREGNANCY, URINE   LIPASE, BLOOD  CBC    Imaging Review No results found. I have personally reviewed and evaluated these images and lab results as part of my medical decision-making.    MDM   Final diagnoses:  None    Patient presents here with complaints of left flank pain for the past several days. She has a history of splenic infarct and was admitted for this several months ago. She states that this pain feels similar. Her workup reveals normal laboratory studies and CT scan shows no evidence for acute intra-abdominal process. She is feeling better with medications in the ER. She will be discharged with pain medication and when necessary return.  I personally performed the services described in this documentation, which was scribed in my presence. The recorded information has been reviewed and is  accurate.       Geoffery Lyons, MD 05/14/15 [id number]

## 2015-05-14 MED ORDER — HYDROCODONE-ACETAMINOPHEN 5-325 MG PO TABS: 1.0000 | ORAL_TABLET | Freq: Four times a day (QID) | ORAL | Status: DC | PRN

## 2015-05-14 MED ORDER — IOHEXOL 300 MG/ML  SOLN
25.0000 mL | Freq: Once | INTRAMUSCULAR | Status: AC | PRN
Start: 2015-05-14 — End: 2015-05-14
  Administered 2015-05-14: 25 mL via ORAL

## 2015-05-14 MED ORDER — IOHEXOL 300 MG/ML  SOLN
100.0000 mL | Freq: Once | INTRAMUSCULAR | Status: AC | PRN
  Administered 2015-05-14: 100 mL via INTRAVENOUS

## 2015-05-14 NOTE — ED Notes (Signed)
Pt verbalizes understanding of d/c instructions and denies any further needs at this time. 

## 2015-05-14 NOTE — Discharge Instructions (Signed)
Hydrocodone as prescribed as needed for pain.  Follow-up with your primary Dr. if not improving in the next week, and return to the ER if symptoms significantly worsen or change.   Flank Pain Flank pain refers to pain that is located on the side of the body between the upper abdomen and the back. The pain may occur over a short period of time (acute) or may be long-term or reoccurring (chronic). It may be mild or severe. Flank pain can be caused by many things. CAUSES  Some of the more common causes of flank pain include:  Muscle strains.   Muscle spasms.   A disease of your spine (vertebral disk disease).   A lung infection (pneumonia).   Fluid around your lungs (pulmonary edema).   A kidney infection.   Kidney stones.   A very painful skin rash caused by the chickenpox virus (shingles).   Gallbladder disease.  HOME CARE INSTRUCTIONS  Home care will depend on the cause of your pain. In general,  Rest as directed by your caregiver.  Drink enough fluids to keep your urine clear or pale yellow.  Only take over-the-counter or prescription medicines as directed by your caregiver. Some medicines may help relieve the pain.  Tell your caregiver about any changes in your pain.  Follow up with your caregiver as directed. SEEK IMMEDIATE MEDICAL CARE IF:   Your pain is not controlled with medicine.   You have new or worsening symptoms.  Your pain increases.   You have abdominal pain.   You have shortness of breath.   You have persistent nausea or vomiting.   You have swelling in your abdomen.   You feel faint or pass out.   You have blood in your urine.  You have a fever or persistent symptoms for more than 2-3 days.  You have a fever and your symptoms suddenly get worse. MAKE SURE YOU:   Understand these instructions.  Will watch your condition.  Will get help right away if you are not doing well or get worse.   This information is not  intended to replace advice given to you by your health care provider. Make sure you discuss any questions you have with your health care provider.   Document Released: 05/28/2005 Document Revised: 12/30/2011 Document Reviewed: 11/19/2011 Elsevier Interactive Patient Education [employer]

## 2015-08-20 ENCOUNTER — Encounter (HOSPITAL_COMMUNITY): Payer: Self-pay | Admitting: Emergency Medicine

## 2015-08-20 ENCOUNTER — Emergency Department (HOSPITAL_COMMUNITY)
Admission: EM | Admit: 2015-08-20 | Discharge: 2015-08-20 | Disposition: A | Discharge status: Home / Self Care | Payer: BLUE CROSS/BLUE SHIELD | Attending: Emergency Medicine | Admitting: Emergency Medicine

## 2015-08-20 DIAGNOSIS — E7800 Pure hypercholesterolemia, unspecified: Secondary | ICD-10-CM | POA: Diagnosis not present

## 2015-08-20 DIAGNOSIS — F329 Major depressive disorder, single episode, unspecified: Secondary | ICD-10-CM | POA: Diagnosis not present

## 2015-08-20 DIAGNOSIS — R300 Dysuria: Secondary | ICD-10-CM | POA: Diagnosis present

## 2015-08-20 DIAGNOSIS — I10 Essential (primary) hypertension: Secondary | ICD-10-CM | POA: Diagnosis not present

## 2015-08-20 DIAGNOSIS — Z7982 Long term (current) use of aspirin: Secondary | ICD-10-CM | POA: Diagnosis not present

## 2015-08-20 DIAGNOSIS — Z791 Long term (current) use of non-steroidal anti-inflammatories (NSAID): Secondary | ICD-10-CM | POA: Diagnosis not present

## 2015-08-20 DIAGNOSIS — Z79891 Long term (current) use of opiate analgesic: Secondary | ICD-10-CM | POA: Diagnosis not present

## 2015-08-20 DIAGNOSIS — R350 Frequency of micturition: Secondary | ICD-10-CM

## 2015-08-20 DIAGNOSIS — Z79899 Other long term (current) drug therapy: Secondary | ICD-10-CM | POA: Diagnosis not present

## 2015-08-20 DIAGNOSIS — F17210 Nicotine dependence, cigarettes, uncomplicated: Secondary | ICD-10-CM | POA: Diagnosis not present

## 2015-08-20 LAB — URINE MICROSCOPIC-ADD ON
BACTERIA UA: NONE SEEN
RBC / HPF: NONE SEEN RBC/hpf (ref 0–5)
SQUAMOUS EPITHELIAL / LPF: NONE SEEN

## 2015-08-20 LAB — URINALYSIS, ROUTINE W REFLEX MICROSCOPIC
GLUCOSE, UA: NEGATIVE mg/dL
Ketones, ur: NEGATIVE mg/dL
NITRITE: NEGATIVE
PROTEIN: NEGATIVE mg/dL
Specific Gravity, Urine: 1.025 (ref 1.005–1.030)
pH: 6 (ref 5.0–8.0)

## 2015-08-20 MED ORDER — IBUPROFEN 800 MG PO TABS
800.0000 mg | ORAL_TABLET | Freq: Once | ORAL | Status: AC
  Administered 2015-08-20: 800 mg via ORAL
  Filled 2015-08-20: qty 1

## 2015-08-20 NOTE — ED Provider Notes (Signed)
CSN: [id number]     Arrival date & time 08/20/15  0015 History   First MD Initiated Contact with Patient 08/20/15 0246     Chief Complaint  Patient presents with  . Dysuria    HPI   45 year old female presents today with complaints of urinary urgency and frequency. Patient reports that since last week she's had discomfort described as "pressure" in her bladder and urinary frequency. She reports small amount of urine with each episode, with urgency several minutes following. Patient reports that she was able to drink more water last week symptoms improved, but now they have returned. She reports symptoms feel similar to previous urinary tract infections. Patient denies any vaginal discharge or bleeding, she reports that she's had a total hysterectomy, she denies any fever, chills, nausea, vomiting, upper abdominal pain, or any other concerning signs or symptoms.  Past Medical History  Diagnosis Date  . Hypertension   . Depression   . Bipolar 1 disorder (HCC)   . High cholesterol   . Splenic infarct    Past Surgical History  Procedure Laterality Date  . Chieloplasty    . Palatoplasty    . Abdominal hysterectomy    . Tubal ligation     Family History  Problem Relation Age of Onset  . CAD Mother   . CAD Father   . CAD Brother   . Stroke Brother    Social History  Substance Use Topics  . Smoking status: Current Every Day Smoker -- 0.25 packs/day for 20 years    Types: Cigarettes  . Smokeless tobacco: Never Used  . Alcohol Use: Yes     Comment: occasional   OB History    No data available     Review of Systems  All other systems reviewed and are negative.   Allergies  Frovatriptan; Imitrex; and Wellbutrin  Home Medications   Prior to Admission medications   Medication Sig Start Date End Date Taking? Authorizing Provider  aspirin 81 MG tablet Take 1 tablet (81 mg total) by mouth daily. 05/01/14  Yes Starleen Arms, MD  Aspirin-Caffeine [phone] MG PACK Take 1 packet  by mouth every 6 (six) hours as needed (For pain.).   Yes Historical Provider, MD  ibuprofen (ADVIL,MOTRIN) 600 MG tablet Take 1 tablet (600 mg total) by mouth every 6 (six) hours as needed. 02/13/15  Yes Linwood Dibbles, MD  lamoTRIgine (LAMICTAL) 25 MG tablet Take 3 tablets by mouth daily. 08/06/15  Yes Historical Provider, MD  lisinopril (PRINIVIL,ZESTRIL) 5 MG tablet Take 5 mg by mouth daily.   Yes Historical Provider, MD  omeprazole (PRILOSEC) 40 MG capsule Take 1 capsule by mouth daily.   Yes Historical Provider, MD  zolpidem (AMBIEN) 10 MG tablet Take 1 tablet by mouth as needed. For sleep   Yes Historical Provider, MD  amLODipine (NORVASC) 10 MG tablet Take 1 tablet (10 mg total) by mouth daily. Patient not taking: Reported on 02/13/2015 05/01/14   Leana Roe Elgergawy, MD  amoxicillin (AMOXIL) 500 MG capsule Take 2 capsules (1,000 mg total) by mouth 2 (two) times daily. Patient not taking: Reported on 02/13/2015 08/16/14   Dione Booze, MD  HYDROcodone-acetaminophen [facility]) 5-325 MG tablet Take 1-2 tablets by mouth every 6 (six) hours as needed. 05/14/15   Geoffery Lyons, MD  oxyCODONE-acetaminophen (PERCOCET) 5-325 MG per tablet Take 1 tablet by mouth every 4 (four) hours as needed for moderate pain. Patient not taking: Reported on 02/13/2015 08/16/14   Dione Booze, MD  penicillin  v potassium (VEETID) 500 MG tablet Take 1 tablet (500 mg total) by mouth 3 (three) times daily. Patient not taking: Reported on 02/13/2015 04/06/14   Dahlia ClientHannah Muthersbaugh, PA-C  senna-docusate (SENOKOT-S) 8.6-50 MG per tablet Take 1 tablet by mouth daily. Patient not taking: Reported on 02/13/2015 05/01/14   Leana Roeawood S Elgergawy, MD  traMADol (ULTRAM) 50 MG tablet Take 1 tablet (50 mg total) by mouth every 6 (six) hours as needed. Patient not taking: Reported on 05/13/2015 02/13/15   Linwood DibblesJon Knapp, MD  zolpidem (AMBIEN) 5 MG tablet Take 5 mg by mouth at bedtime as needed for sleep.  03/21/14 03/21/15  Historical Provider, MD   BP  165/90 mmHg  Pulse 72  Temp(Src) 97.9 F (36.6 C) (Oral)  Resp 20  SpO2 99%   Physical Exam  Constitutional: She is oriented to person, place, and time. She appears well-developed and well-nourished.  HENT:  Head: Normocephalic and atraumatic.  Eyes: Conjunctivae are normal. Pupils are equal, round, and reactive to light. Right eye exhibits no discharge. Left eye exhibits no discharge. No scleral icterus.  Neck: Normal range of motion. No JVD present. No tracheal deviation present.  Pulmonary/Chest: Effort normal. No stridor.  Abdominal: She exhibits no distension and no mass. There is tenderness. There is no rebound and no guarding.  Minor tenderness to palpation over the bladder  Musculoskeletal: Normal range of motion. She exhibits no edema or tenderness.  Neurological: She is alert and oriented to person, place, and time. Coordination normal.  Skin: Skin is warm and dry. No rash noted. No erythema. No pallor.  Psychiatric: She has a normal mood and affect. Her behavior is normal. Judgment and thought content normal.  Nursing note and vitals reviewed.   ED Course  Procedures (including critical care time) Labs Review Labs Reviewed  URINALYSIS, ROUTINE W REFLEX MICROSCOPIC (NOT AT [facility]) - Abnormal; Notable for the following:    APPearance CLOUDY (*)    Hgb urine dipstick TRACE (*)    Bilirubin Urine SMALL (*)    Leukocytes, UA MODERATE (*)    All other components within normal limits  URINE CULTURE  URINE MICROSCOPIC-ADD ON    Imaging Review No results found. I have personally reviewed and evaluated these images and lab results as part of my medical decision-making.   EKG Interpretation None      MDM   Final diagnoses:  Urinary frequency    Labs: Urinalysis, urine culture  Imaging:  Consults:  Therapeutics:  Discharge Meds:   Assessment/Plan: Well-appearing female in no acute distress presents with urinary complaints. Patient notes frequency and  pressure in her bladder, this is waxed and waned. Patient has no bacteria in her urine. Patient has very minor tenderness to palpation, no significant findings that would indicate necessity for further evaluation or management here in the ED. Patient has no signs of glucose in the urine.  Patient reports she will be able follow up with her primary care provider in short period of time, she is instructed to make this appointment as soon as possible, monitor for any new or worsening signs or symptoms. Patient has total hysterectomy, she has no infectious etiology, vaginal discharge or bleeding, no upper abdominal pain.        Eyvonne MechanicJeffrey Sutter Ahlgren, PA-C 08/20/15 [id number]  Geoffery Lyonsouglas Delo, MD 08/21/15 2330

## 2015-08-20 NOTE — ED Notes (Signed)
Pt states that she has had dysuria since last week that faded but has now gotten worse. Alert and oriented. Denies vaginal problems/discharge.

## 2015-08-20 NOTE — Discharge Instructions (Signed)
Urinary Frequency °The number of times a normal person urinates depends upon how much liquid they take in and how much liquid they are losing. If the temperature is hot and there is high humidity, then the person will sweat more and usually breathe a little more frequently. These factors decrease the amount of frequency of urination that would be considered normal. °The amount you drink is easily determined, but the amount of fluid lost is sometimes more difficult to calculate.  °Fluid is lost in two ways: °· Sensible fluid loss is usually measured by the amount of urine that you get rid of. Losses of fluid can also occur with diarrhea. °· Insensible fluid loss is more difficult to measure. It is caused by evaporation. Insensible loss of fluid occurs through breathing and sweating. It usually ranges from a little less than a quart to a little more than a quart of fluid a day. °In normal temperatures and activity levels, the average person may urinate 4 to 7 times in a 24-hour period. Needing to urinate more often than that could indicate a problem. If one urinates 4 to 7 times in 24 hours and has large volumes each time, that could indicate a different problem from one who urinates 4 to 7 times a day and has small volumes. The time of urinating is also important. Most urinating should be done during the waking hours. Getting up at night to urinate frequently can indicate some problems. °CAUSES  °The bladder is the organ in your lower abdomen that holds urine. Like a balloon, it swells some as it fills up. Your nerves sense this and tell you it is time to head for the bathroom. There are a number of reasons that you might feel the need to urinate more often than usual. They include: °· Urinary tract infection. This is usually associated with other signs such as burning when you urinate. °· In men, problems with the prostate (a walnut-size gland that is located near the tube that carries urine out of your body). There  are two reasons why the prostate can cause an increased frequency of urination: °¨ An enlarged prostate that does not let the bladder empty well. If the bladder only half empties when you urinate, then it only has half the capacity to fill before you have to urinate again. °¨ The nerves in the bladder become more hypersensitive with an increased size of the prostate even if the bladder empties completely. °· Pregnancy. °· Obesity. Excess weight is more likely to cause a problem for women than for men. °· Bladder stones or other bladder problems. °· Caffeine. °· Alcohol. °· Medications. For example, drugs that help the body get rid of extra fluid (diuretics) increase urine production. Some other medicines must be taken with lots of fluids. °· Muscle or nerve weakness. This might be the result of a spinal cord injury, a stroke, multiple sclerosis, or Parkinson disease. °· Long-standing diabetes can decrease the sensation of the bladder. This loss of sensation makes it harder to sense the bladder needs to be emptied. Over a period of years, the bladder is stretched out by constant overfilling. This weakens the bladder muscles so that the bladder does not empty well and has less capacity to fill with new urine. °· Interstitial cystitis (also called painful bladder syndrome). This condition develops because the tissues that line the inside of the bladder are inflamed (inflammation is the body's way of reacting to injury or infection). It causes pain and frequent   urination. It occurs in women more often than in men. °DIAGNOSIS  °· To decide what might be causing your urinary frequency, your health care provider will probably: °¨ Ask about symptoms you have noticed. °¨ Ask about your overall health. This will include questions about any medications you are taking. °¨ Do a physical examination. °· Order some tests. These might include: °¨ A blood test to check for diabetes or other health issues that could be contributing  to the problem. °¨ Urine testing. This could measure the flow of urine and the pressure on the bladder. °¨ A test of your neurological system (the brain, spinal cord, and nerves). This is the system that senses the need to urinate. °¨ A bladder test to check whether it is emptying completely when you urinate. °¨ Cystoscopy. This test uses a thin tube with a tiny camera on it. It offers a look inside your urethra and bladder to see if there are problems. °¨ Imaging tests. You might be given a contrast dye and then asked to urinate. X-rays are taken to see how your bladder is working. °TREATMENT  °It is important for you to be evaluated to determine if the amount or frequency that you have is unusual or abnormal. If it is found to be abnormal, the cause should be determined and this can usually be found out easily. Depending upon the cause, treatment could include medication, stimulation of the nerves, or surgery. °There are not too many things that you can do as an individual to change your urinary frequency. It is important that you balance the amount of fluid intake needed to compensate for your activity and the temperature. Medical problems will be diagnosed and taken care of by your physician. There is no particular bladder training such as Kegel exercises that you can do to help urinary frequency. This is an exercise that is usually recommended for people who have leaking of urine when they laugh, cough, or sneeze. °HOME CARE INSTRUCTIONS  °· Take any medications your health care provider prescribed or suggested. Follow the directions carefully. °· Practice any lifestyle changes that are recommended. These might include: °¨ Drinking less fluid or drinking at different times of the day. If you need to urinate often during the night, for example, you may need to stop drinking fluids early in the evening. °¨ Cutting down on caffeine or alcohol. They both can make you need to urinate more often than normal. Caffeine  is found in coffee, tea, and sodas. °¨ Losing weight, if that is recommended. °· Keep a journal or a log. You might be asked to record how much you drink and when and where you feel the need to urinate. This will also help evaluate how well the treatment provided by your physician is working. °SEEK MEDICAL CARE IF:  °· Your need to urinate often gets worse. °· You feel increased pain or irritation when you urinate. °· You notice blood in your urine. °· You have questions about any medications that your health care provider recommended. °· You notice blood, pus, or swelling at the site of any test or treatment procedure. °· You develop a fever of more than 100.5°F (38.1°C). °SEEK IMMEDIATE MEDICAL CARE IF:  °You develop a fever of more than 102.0°F (38.9°C). °  °This information is not intended to replace advice given to you by your health care provider. Make sure you discuss any questions you have with your health care provider. °  °Document Released: 01/31/2009 Document Revised:   04/27/2014 Document Reviewed: 01/31/2009 °Elsevier Interactive Patient Education ©2016 [employer] ° °

## 2015-08-21 LAB — URINE CULTURE: Special Requests: NORMAL

## 2015-12-01 ENCOUNTER — Emergency Department (HOSPITAL_BASED_OUTPATIENT_CLINIC_OR_DEPARTMENT_OTHER)
Admission: EM | Admit: 2015-12-01 | Discharge: 2015-12-01 | Disposition: A | Discharge status: Home / Self Care | Payer: BLUE CROSS/BLUE SHIELD | Attending: Emergency Medicine | Admitting: Emergency Medicine

## 2015-12-01 ENCOUNTER — Encounter (HOSPITAL_BASED_OUTPATIENT_CLINIC_OR_DEPARTMENT_OTHER): Payer: Self-pay | Admitting: Emergency Medicine

## 2015-12-01 ENCOUNTER — Emergency Department (HOSPITAL_BASED_OUTPATIENT_CLINIC_OR_DEPARTMENT_OTHER): Payer: BLUE CROSS/BLUE SHIELD

## 2015-12-01 DIAGNOSIS — R091 Pleurisy: Secondary | ICD-10-CM | POA: Diagnosis not present

## 2015-12-01 DIAGNOSIS — I10 Essential (primary) hypertension: Secondary | ICD-10-CM | POA: Insufficient documentation

## 2015-12-01 DIAGNOSIS — F17210 Nicotine dependence, cigarettes, uncomplicated: Secondary | ICD-10-CM | POA: Diagnosis not present

## 2015-12-01 DIAGNOSIS — J029 Acute pharyngitis, unspecified: Secondary | ICD-10-CM | POA: Insufficient documentation

## 2015-12-01 DIAGNOSIS — Z79899 Other long term (current) drug therapy: Secondary | ICD-10-CM | POA: Insufficient documentation

## 2015-12-01 DIAGNOSIS — R079 Chest pain, unspecified: Secondary | ICD-10-CM | POA: Diagnosis present

## 2015-12-01 LAB — CBC WITH DIFFERENTIAL/PLATELET
BASOS ABS: 0.1 10*3/uL (ref 0.0–0.1)
Basophils Relative: 1 %
EOS ABS: 0.3 10*3/uL (ref 0.0–0.7)
EOS PCT: 3 %
HCT: 37.7 % (ref 36.0–46.0)
HEMOGLOBIN: 12.9 g/dL (ref 12.0–15.0)
LYMPHS ABS: 3.6 10*3/uL (ref 0.7–4.0)
LYMPHS PCT: 38 %
MCH: 36.5 pg — AB (ref 26.0–34.0)
MCHC: 34.2 g/dL (ref 30.0–36.0)
MCV: 106.8 fL — ABNORMAL HIGH (ref 78.0–100.0)
Monocytes Absolute: 0.7 10*3/uL (ref 0.1–1.0)
Monocytes Relative: 8 %
NEUTROS PCT: 50 %
Neutro Abs: 4.7 10*3/uL (ref 1.7–7.7)
PLATELETS: 337 10*3/uL (ref 150–400)
RBC: 3.53 MIL/uL — AB (ref 3.87–5.11)
RDW: 11.9 % (ref 11.5–15.5)
WBC: 9.4 10*3/uL (ref 4.0–10.5)

## 2015-12-01 LAB — BASIC METABOLIC PANEL
Anion gap: 9 (ref 5–15)
BUN: 10 mg/dL (ref 6–20)
CALCIUM: 9.2 mg/dL (ref 8.9–10.3)
CO2: 23 mmol/L (ref 22–32)
CREATININE: 0.86 mg/dL (ref 0.44–1.00)
Chloride: 107 mmol/L (ref 101–111)
GFR calc Af Amer: >60 mL/min (ref >60)
GLUCOSE: 104 mg/dL — AB (ref 65–99)
Potassium: 3.6 mmol/L (ref 3.5–5.1)
Sodium: 139 mmol/L (ref 135–145)

## 2015-12-01 LAB — D-DIMER, QUANTITATIVE: D-Dimer, Quant: <0.27 ug/mL-FEU (ref 0.00–0.50)

## 2015-12-01 LAB — TROPONIN I

## 2015-12-01 MED ORDER — HYDROCODONE-ACETAMINOPHEN 5-325 MG PO TABS: 1.0000 | ORAL_TABLET | ORAL | 0 refills | Status: DC | PRN

## 2015-12-01 MED ORDER — NAPROXEN 500 MG PO TABS: 500.0000 mg | ORAL_TABLET | Freq: Two times a day (BID) | ORAL | 0 refills | Status: DC

## 2015-12-01 MED ORDER — MORPHINE SULFATE (PF) 4 MG/ML IV SOLN
4.0000 mg | INTRAVENOUS | Status: DC | PRN
  Administered 2015-12-01: 4 mg via INTRAVENOUS
  Filled 2015-12-01: qty 1

## 2015-12-01 MED ORDER — MORPHINE SULFATE (PF) 4 MG/ML IV SOLN
4.0000 mg | Freq: Once | INTRAVENOUS | Status: AC
  Administered 2015-12-01: 4 mg via INTRAVENOUS
  Filled 2015-12-01: qty 1

## 2015-12-01 MED ORDER — KETOROLAC TROMETHAMINE 30 MG/ML IJ SOLN
30.0000 mg | Freq: Once | INTRAMUSCULAR | Status: AC
  Administered 2015-12-01: 30 mg via INTRAVENOUS
  Filled 2015-12-01: qty 1

## 2015-12-01 MED ORDER — ONDANSETRON HCL 4 MG/2ML IJ SOLN
4.0000 mg | Freq: Once | INTRAMUSCULAR | Status: AC
Start: 2015-12-01 — End: 2015-12-01
  Administered 2015-12-01: 4 mg via INTRAVENOUS
  Filled 2015-12-01: qty 2

## 2015-12-01 NOTE — ED Provider Notes (Signed)
[facility] Note   CSN: [id number] Arrival date & time: 12/01/15  1754  First Provider Contact:   First MD Initiated Contact with Patient 12/01/15 1841      By signing my name below, I, Soijett Blue, attest that this documentation has been prepared under the direction and in the presence of Rolland Porter, MD. Electronically Signed: Soijett Blue, ED Scribe. 12/01/15. 6:50 PM.   History   Chief Complaint Chief Complaint  Patient presents with  . Chest Pain    HPI  Miranda Bernard is a 45 y.o. female with a medical hx of splenic infarct, HTN, high cholesterol, who presents to the Emergency Department complaining of sharp CP underneath her left breast onset 2 days. Pt notes that her CP is worsened with breathing and movement. Denies alleviating factors. Pt states that she had a cold at the beginning of the month with sore throat, resolved fever, cough, and resolved hemoptysis x 6 days ago.   She states that she is having associated symptoms of SOB, cough, and dizziness. She states that she has not tried any medications for the relief for her symptoms. She denies nausea, vomiting, leg swelling, and any other symptoms. Denies hx of sickle cell in her family. Pt states that she is a current cigarette smoker. Denies use of estrogen or blood thinners.   The history is provided by the patient. No language interpreter was used.    Past Medical History:  Diagnosis Date  . Bipolar 1 disorder (HCC)   . Depression   . High cholesterol   . Hypertension   . Splenic infarct     Patient Active Problem List   Diagnosis Date Noted  . Abdominal pain 04/30/2014  . Splenic infarct 04/30/2014  . Hypertension, uncontrolled 04/30/2014  . Numbness on left side 04/30/2014  . Hyperlipidemia 04/30/2014  . Bipolar disorder (HCC) 04/30/2014    Past Surgical History:  Procedure Laterality Date  . ABDOMINAL HYSTERECTOMY    . chieloplasty    . PALATOPLASTY    . TUBAL LIGATION       OB History    No data available       Home Medications    Prior to Admission medications   Medication Sig Start Date End Date Taking? Authorizing Provider  atorvastatin (LIPITOR) 40 MG tablet Take 40 mg by mouth daily.   Yes Historical Provider, MD  FLUoxetine (PROZAC) 20 MG tablet Take 20 mg by mouth daily.   Yes Historical Provider, MD  amLODipine (NORVASC) 10 MG tablet Take 1 tablet (10 mg total) by mouth daily. Patient not taking: Reported on 02/13/2015 05/01/14   Leana Roe Elgergawy, MD  amoxicillin (AMOXIL) 500 MG capsule Take 2 capsules (1,000 mg total) by mouth 2 (two) times daily. Patient not taking: Reported on 02/13/2015 08/16/14   Dione Booze, MD  aspirin 81 MG tablet Take 1 tablet (81 mg total) by mouth daily. 05/01/14   Leana Roe Elgergawy, MD  Aspirin-Caffeine [phone] MG PACK Take 1 packet by mouth every 6 (six) hours as needed (For pain.).    Historical Provider, MD  HYDROcodone-acetaminophen (NORCO/VICODIN) 5-325 MG tablet Take 1 tablet by mouth every 4 (four) hours as needed. 12/01/15   Rolland Porter, MD  ibuprofen (ADVIL,MOTRIN) 600 MG tablet Take 1 tablet (600 mg total) by mouth every 6 (six) hours as needed. 02/13/15   Linwood Dibbles, MD  lamoTRIgine (LAMICTAL) 25 MG tablet Take 3 tablets by mouth daily. 08/06/15   Historical Provider, MD  lisinopril (PRINIVIL,ZESTRIL) 5 MG tablet Take 5 mg by mouth daily.    Historical Provider, MD  naproxen (NAPROSYN) 500 MG tablet Take 1 tablet (500 mg total) by mouth 2 (two) times daily. 12/01/15   Rolland PorterMark Kareli Hossain, MD  omeprazole (PRILOSEC) 40 MG capsule Take 1 capsule by mouth daily.    Historical Provider, MD  oxyCODONE-acetaminophen (PERCOCET) 5-325 MG per tablet Take 1 tablet by mouth every 4 (four) hours as needed for moderate pain. Patient not taking: Reported on 02/13/2015 08/16/14   Dione Boozeavid Glick, MD  penicillin v potassium (VEETID) 500 MG tablet Take 1 tablet (500 mg total) by mouth 3 (three) times daily. Patient not taking: Reported on  02/13/2015 04/06/14   Dahlia ClientHannah Muthersbaugh, PA-C  senna-docusate (SENOKOT-S) 8.6-50 MG per tablet Take 1 tablet by mouth daily. Patient not taking: Reported on 02/13/2015 05/01/14   Leana Roeawood S Elgergawy, MD  traMADol (ULTRAM) 50 MG tablet Take 1 tablet (50 mg total) by mouth every 6 (six) hours as needed. Patient not taking: Reported on 05/13/2015 02/13/15   Linwood DibblesJon Knapp, MD  zolpidem (AMBIEN) 10 MG tablet Take 1 tablet by mouth as needed. For sleep    Historical Provider, MD  zolpidem (AMBIEN) 5 MG tablet Take 5 mg by mouth at bedtime as needed for sleep.  03/21/14 03/21/15  Historical Provider, MD    Family History Family History  Problem Relation Age of Onset  . CAD Mother   . CAD Father   . CAD Brother   . Stroke Brother     Social History Social History  Substance Use Topics  . Smoking status: Current Every Day Smoker    Packs/day: 0.25    Years: 20.00    Types: Cigarettes  . Smokeless tobacco: Never Used  . Alcohol use Yes     Comment: occasional     Allergies   Frovatriptan; Imitrex [sumatriptan]; and Wellbutrin [bupropion]   Review of Systems Review of Systems  Constitutional: Negative for appetite change, chills, diaphoresis, fatigue and fever.  HENT: Positive for sore throat. Negative for mouth sores and trouble swallowing.   Eyes: Negative for visual disturbance.  Respiratory: Positive for cough. Negative for chest tightness, shortness of breath and wheezing.        +resolved hemoptysis  Cardiovascular: Positive for chest pain.  Gastrointestinal: Negative for abdominal distention, abdominal pain, diarrhea, nausea and vomiting.  Endocrine: Negative for polydipsia, polyphagia and polyuria.  Genitourinary: Negative for dysuria, frequency and hematuria.  Musculoskeletal: Negative for gait problem.  Skin: Negative for color change, pallor and rash.  Neurological: Negative for dizziness, syncope, light-headedness and headaches.  Hematological: Does not bruise/bleed easily.   Psychiatric/Behavioral: Negative for behavioral problems and confusion.     Physical Exam Updated Vital Signs BP 170/90   Pulse 70   Temp 97.9 F (36.6 C) (Oral)   Resp 11   Ht 5\' 3"  (1.6 m)   Wt 175 lb (79.4 kg)   SpO2 100%   BMI 31.00 kg/m   Physical Exam  Constitutional: She is oriented to person, place, and time. She appears well-developed and well-nourished. No distress.  HENT:  Head: Normocephalic.  Post-surgical cleft changes.  Eyes: Conjunctivae are normal. Pupils are equal, round, and reactive to light. No scleral icterus.  Neck: Normal range of motion. Neck supple. No thyromegaly present.  Cardiovascular: Normal rate and regular rhythm.  Exam reveals no gallop and no friction rub.   No murmur heard. Pulmonary/Chest: Effort normal and breath sounds normal. No respiratory distress. She has  no wheezes. She has no rales.  Pt hold hand across left chest and complains of severe pain.   Abdominal: Soft. Bowel sounds are normal. She exhibits no distension. There is no tenderness. There is no rebound.  Musculoskeletal: Normal range of motion.  Neurological: She is alert and oriented to person, place, and time.  Skin: Skin is warm and dry. No rash noted.  No rash or shingles noted  Psychiatric: She has a normal mood and affect. Her behavior is normal.     ED Treatments / Results  DIAGNOSTIC STUDIES: Oxygen Saturation is 100% on RA, nl by my interpretation.    COORDINATION OF CARE: 6:48 PM Discussed treatment plan with pt at bedside which includes labs, UA, EKG, morphine, zofran, CXR and pt agreed to plan.   Labs (all labs ordered are listed, but only abnormal results are displayed) Labs Reviewed  CBC WITH DIFFERENTIAL/PLATELET - Abnormal; Notable for the following:       Result Value   RBC 3.53 (*)    MCV 106.8 (*)    MCH 36.5 (*)    All other components within normal limits  BASIC METABOLIC PANEL - Abnormal; Notable for the following:    Glucose, Bld 104 (*)     All other components within normal limits  D-DIMER, QUANTITATIVE (NOT AT [facility])  TROPONIN I    EKG  EKG Interpretation  Date/Time:  Sunday December 01 2015 18:14:44 EDT Ventricular Rate:  77 PR Interval:  160 QRS Duration: 70 QT Interval:  408 QTC Calculation: 461 R Axis:   24 Text Interpretation:  Normal sinus rhythm Normal ECG Confirmed by Fayrene Fearing  MD, Lively Haberman ([id number]) on 12/01/2015 6:39:44 PM       Radiology Dg Chest 2 View  Result Date: 12/01/2015 CLINICAL DATA:  Left-sided chest pain EXAM: CHEST  2 VIEW COMPARISON:  04/29/2014 FINDINGS: The heart size and mediastinal contours are within normal limits. Both lungs are clear. The visualized skeletal structures are unremarkable. IMPRESSION: No active cardiopulmonary disease. Electronically Signed   By: Alcide Clever M.D.   On: 12/01/2015 19:10    Procedures Procedures (including critical care time)  Medications Ordered in ED Medications  morphine 4 MG/ML injection 4 mg (4 mg Intravenous Given 12/01/15 1959)  ketorolac (TORADOL) 30 MG/ML injection 30 mg (not administered)  morphine 4 MG/ML injection 4 mg (not administered)  ondansetron (ZOFRAN) injection 4 mg (4 mg Intravenous Given 12/01/15 1959)     Initial Impression / Assessment and Plan / ED Course  I have reviewed the triage vital signs and the nursing notes.  Pertinent labs & imaging results that were available during my care of the patient were reviewed by me and considered in my medical decision making (see chart for details).  Clinical Course    Patient with clear lungs. Not tachycardic. Afebrile. Not hypoxemic. Saturation 96%. No tenderness across the chest wall. No vesicles to suggest zoster. States she had a splenic infarct "a little while ago". In review of her hospital records she did have splenic infarct and underwent hypercoagulable panel. Had an elevated homocysteine level. (Upper limits of normal 15, was 49). Is not anticoagulated. Would be at risk for pulmonary  embolus/pulmonary infarct. We'll obtain d-dimer. Pain control. X-rays BASIC labs, reevaluation  Final Clinical Impressions(s) / ED Diagnoses   Final diagnoses:  Pleurisy    New Prescriptions New Prescriptions   HYDROCODONE-ACETAMINOPHEN (NORCO/VICODIN) 5-325 MG TABLET    Take 1 tablet by mouth every 4 (four) hours as needed.  NAPROXEN (NAPROSYN) 500 MG TABLET    Take 1 tablet (500 mg total) by mouth 2 (two) times daily.   Negative d-dimer. Normal chest x-ray. Troponin normal. Chest x-ray normal. Symptoms and findings consistent with pleurisy. Plan will be home, anti-inflammatories, pain medicine, primary care follow-up.    Rolland Porter, MD 12/01/15 2116

## 2015-12-01 NOTE — ED Notes (Signed)
Patient is still hurting to her left chest. Patient has a ride home. meds given prior  To the patient leaving - the patient is A/O x 3 prior to leaving and states that her chest feels better now that she has had an extra dose of medication

## 2015-12-01 NOTE — ED Notes (Signed)
Morphine given after patient confirmed she had a ride home.

## 2015-12-01 NOTE — ED Triage Notes (Signed)
L chest pain undrneath her breast x 3 days. Pt reports SOB and cough also.

## 2015-12-02 ENCOUNTER — Emergency Department (HOSPITAL_COMMUNITY)
Admission: EM | Admit: 2015-12-02 | Discharge: 2015-12-02 | Disposition: A | Discharge status: Home / Self Care | Payer: BLUE CROSS/BLUE SHIELD | Attending: Emergency Medicine | Admitting: Emergency Medicine

## 2015-12-02 ENCOUNTER — Encounter (HOSPITAL_COMMUNITY): Payer: Self-pay | Admitting: Emergency Medicine

## 2015-12-02 ENCOUNTER — Emergency Department (HOSPITAL_COMMUNITY): Payer: BLUE CROSS/BLUE SHIELD

## 2015-12-02 DIAGNOSIS — Z79899 Other long term (current) drug therapy: Secondary | ICD-10-CM | POA: Diagnosis not present

## 2015-12-02 DIAGNOSIS — F17210 Nicotine dependence, cigarettes, uncomplicated: Secondary | ICD-10-CM | POA: Diagnosis not present

## 2015-12-02 DIAGNOSIS — R0782 Intercostal pain: Secondary | ICD-10-CM

## 2015-12-02 DIAGNOSIS — R1012 Left upper quadrant pain: Secondary | ICD-10-CM | POA: Diagnosis present

## 2015-12-02 DIAGNOSIS — Z7982 Long term (current) use of aspirin: Secondary | ICD-10-CM | POA: Insufficient documentation

## 2015-12-02 DIAGNOSIS — I10 Essential (primary) hypertension: Secondary | ICD-10-CM | POA: Insufficient documentation

## 2015-12-02 MED ORDER — SODIUM CHLORIDE 0.9 % IV SOLN
INTRAVENOUS | Status: DC
  Administered 2015-12-02: 15:00:00 via INTRAVENOUS

## 2015-12-02 MED ORDER — PROMETHAZINE HCL 25 MG/ML IJ SOLN
12.5000 mg | Freq: Once | INTRAMUSCULAR | Status: AC
  Administered 2015-12-02: 12.5 mg via INTRAVENOUS
  Filled 2015-12-02: qty 1

## 2015-12-02 MED ORDER — KETOROLAC TROMETHAMINE 15 MG/ML IJ SOLN
15.0000 mg | Freq: Once | INTRAMUSCULAR | Status: AC
  Administered 2015-12-02: 15 mg via INTRAVENOUS
  Filled 2015-12-02: qty 1

## 2015-12-02 MED ORDER — IOPAMIDOL (ISOVUE-300) INJECTION 61%
INTRAVENOUS | Status: AC
  Administered 2015-12-02: 100 mL
  Filled 2015-12-02: qty 100

## 2015-12-02 MED ORDER — ONDANSETRON HCL 4 MG/2ML IJ SOLN
4.0000 mg | Freq: Once | INTRAMUSCULAR | Status: AC
  Administered 2015-12-02: 4 mg via INTRAVENOUS
  Filled 2015-12-02: qty 2

## 2015-12-02 MED ORDER — HYDROMORPHONE HCL 1 MG/ML IJ SOLN
0.7500 mg | Freq: Once | INTRAMUSCULAR | Status: AC
  Administered 2015-12-02: 0.75 mg via INTRAVENOUS
  Filled 2015-12-02: qty 1

## 2015-12-02 NOTE — ED Notes (Signed)
Patient is tender to the left ribs under the left breast.

## 2015-12-02 NOTE — ED Notes (Signed)
Patient and family verbalized understanding of discharge instructions.  Verbalized understanding that due to Phenergan administered that patient is not to drive or operate any type of vehicle until effects of medication have warn off, patient advised to go home and sleep off medication.

## 2015-12-02 NOTE — ED Notes (Signed)
Patient physically moved from the hallway to room A11; patient already in gown, placed on monitor, continuous pulse oximetry and blood pressure cuff; patient also handed phone to use

## 2015-12-02 NOTE — ED Triage Notes (Signed)
Patient presents today with complaints of LUQ pain states was [facility] given pain medication. Patient states pain has become increasing worst. Tender to touch. Pain increases with movement, patient states pain feels the same as the last spleen infarct. Patient given 100mcg fentanyl. Patient pain 5/10.

## 2015-12-02 NOTE — ED Provider Notes (Signed)
[facility]-EMERGENCY DEPT Provider Note   CSN: [id number] Arrival date & time: 12/02/15  1305  By signing my name below, I, Nelwyn SalisburyJoshua Fowler, attest that this documentation has been prepared under the direction and in the presence of Raeford RazorStephen Payden Docter, MD . Electronically Signed: Nelwyn SalisburyJoshua Fowler, Scribe. 12/02/2015. 1:59 PM.   History   Chief Complaint Chief Complaint  Patient presents with  . Abdominal Pain   The history is provided by the patient. No language interpreter was used.    HPI Comments:  Miranda Bernard is a 45 y.o. female with PMHx of splenic infarct who presents to the Emergency Department complaining of worsening sharp constant left upper quadrant pain onset four days ago. She reports an episode of pain that caused her to lose her breath and sit down. Her pain is worsened by deep breaths and palpation. No alleviating factors noted. Pt reports she saw her PCP this morning who told her to come into the ER today to receive a CT due to her history of splenic infarct. She also states she came to the hospital yesterday and was told she had pleurisy. She was prescribed pain medication that she did not take due to vomiting. She reports associated  left sided cramps, urinary urgency, diffuse itching and vomiting beginning this morning. Pt also reports cough and rhinorrhea which she has had the past 3 days. Pt denies dysuria, hematuria, fever and chills. She has taken OTC ibuprofen for pain. Pt is not currently taking any blood thinners but she has been off them since last year.   Past Medical History:  Diagnosis Date  . Bipolar 1 disorder (HCC)   . Depression   . High cholesterol   . Hypertension   . Splenic infarct     Patient Active Problem List   Diagnosis Date Noted  . Abdominal pain 04/30/2014  . Splenic infarct 04/30/2014  . Hypertension, uncontrolled 04/30/2014  . Numbness on left side 04/30/2014  . Hyperlipidemia 04/30/2014  . Bipolar disorder (HCC) 04/30/2014    Past  Surgical History:  Procedure Laterality Date  . ABDOMINAL HYSTERECTOMY    . chieloplasty    . PALATOPLASTY    . TUBAL LIGATION      OB History    No data available       Home Medications    Prior to Admission medications   Medication Sig Start Date End Date Taking? Authorizing Provider  amLODipine (NORVASC) 10 MG tablet Take 1 tablet (10 mg total) by mouth daily. Patient not taking: Reported on 02/13/2015 05/01/14   Leana Roeawood S Elgergawy, MD  amoxicillin (AMOXIL) 500 MG capsule Take 2 capsules (1,000 mg total) by mouth 2 (two) times daily. Patient not taking: Reported on 02/13/2015 08/16/14   Dione Boozeavid Glick, MD  aspirin 81 MG tablet Take 1 tablet (81 mg total) by mouth daily. 05/01/14   Leana Roeawood S Elgergawy, MD  Aspirin-Caffeine [id number] MG PACK Take 1 packet by mouth every 6 (six) hours as needed (For pain.).    Historical Provider, MD  atorvastatin (LIPITOR) 40 MG tablet Take 40 mg by mouth daily.    Historical Provider, MD  FLUoxetine (PROZAC) 20 MG tablet Take 20 mg by mouth daily.    Historical Provider, MD  HYDROcodone-acetaminophen (NORCO/VICODIN) 5-325 MG tablet Take 1 tablet by mouth every 4 (four) hours as needed. 12/01/15   Rolland PorterMark James, MD  ibuprofen (ADVIL,MOTRIN) 600 MG tablet Take 1 tablet (600 mg total) by mouth every 6 (six) hours as needed. 02/13/15   Linwood DibblesJon Knapp,  MD  lamoTRIgine (LAMICTAL) 25 MG tablet Take 3 tablets by mouth daily. 08/06/15   Historical Provider, MD  lisinopril (PRINIVIL,ZESTRIL) 5 MG tablet Take 5 mg by mouth daily.    Historical Provider, MD  naproxen (NAPROSYN) 500 MG tablet Take 1 tablet (500 mg total) by mouth 2 (two) times daily. 12/01/15   Rolland Porter, MD  omeprazole (PRILOSEC) 40 MG capsule Take 1 capsule by mouth daily.    Historical Provider, MD  oxyCODONE-acetaminophen (PERCOCET) 5-325 MG per tablet Take 1 tablet by mouth every 4 (four) hours as needed for moderate pain. Patient not taking: Reported on 02/13/2015 08/16/14   Dione Booze, MD  penicillin v  potassium (VEETID) 500 MG tablet Take 1 tablet (500 mg total) by mouth 3 (three) times daily. Patient not taking: Reported on 02/13/2015 04/06/14   Dahlia Client Muthersbaugh, PA-C  senna-docusate (SENOKOT-S) 8.6-50 MG per tablet Take 1 tablet by mouth daily. Patient not taking: Reported on 02/13/2015 05/01/14   Leana Roe Elgergawy, MD  traMADol (ULTRAM) 50 MG tablet Take 1 tablet (50 mg total) by mouth every 6 (six) hours as needed. Patient not taking: Reported on 05/13/2015 02/13/15   Linwood Dibbles, MD  zolpidem (AMBIEN) 10 MG tablet Take 1 tablet by mouth as needed. For sleep    Historical Provider, MD  zolpidem (AMBIEN) 5 MG tablet Take 5 mg by mouth at bedtime as needed for sleep.  03/21/14 03/21/15  Historical Provider, MD    Family History Family History  Problem Relation Age of Onset  . CAD Mother   . CAD Father   . CAD Brother   . Stroke Brother     Social History Social History  Substance Use Topics  . Smoking status: Current Every Day Smoker    Packs/day: 0.25    Years: 20.00    Types: Cigarettes  . Smokeless tobacco: Never Used  . Alcohol use Yes     Comment: occasional     Allergies   Frovatriptan; Imitrex [sumatriptan]; and Wellbutrin [bupropion]   Review of Systems Review of Systems  Constitutional: Negative for chills and fever.  HENT: Positive for rhinorrhea.   Respiratory: Positive for cough.   Gastrointestinal: Positive for abdominal pain and vomiting.  Genitourinary: Positive for urgency. Negative for dysuria and hematuria.  All other systems reviewed and are negative.    Physical Exam Updated Vital Signs BP 152/78 (BP Location: Right Arm)   Pulse 62   Temp 98 F (36.7 C) (Oral)   Resp 20   SpO2 100%   Physical Exam  Constitutional: She is oriented to person, place, and time. She appears well-developed and well-nourished. No distress.  HENT:  Head: Normocephalic and atraumatic.  Eyes: Conjunctivae are normal.  Cardiovascular: Normal rate.     Pulmonary/Chest: Effort normal.  Abdominal: She exhibits no distension.  Musculoskeletal:  Tender on left costal margin  Neurological: She is alert and oriented to person, place, and time.  Skin: Skin is warm and dry.  Psychiatric: She has a normal mood and affect.  Nursing note and vitals reviewed.    ED Treatments / Results  DIAGNOSTIC STUDIES:  Oxygen Saturation is 100% on RA, normal by my interpretation.  COORDINATION OF CARE:  1:59 PM Discussed treatment plan with pt at bedside which included medication for pain and nausea and pt agreed to plan.  Labs (all labs ordered are listed, but only abnormal results are displayed) Labs Reviewed - No data to display  EKG  EKG Interpretation None  Radiology Dg Chest 2 View  Result Date: 12/01/2015 CLINICAL DATA:  Left-sided chest pain EXAM: CHEST  2 VIEW COMPARISON:  04/29/2014 FINDINGS: The heart size and mediastinal contours are within normal limits. Both lungs are clear. The visualized skeletal structures are unremarkable. IMPRESSION: No active cardiopulmonary disease. Electronically Signed   By: Alcide CleverMark  Lukens M.D.   On: 12/01/2015 19:10   Ct Abdomen Pelvis W Contrast  Result Date: 12/02/2015 CLINICAL DATA:  Left upper quadrant abdominal pain with nausea and vomiting for 3 days. Prior splenic infarct. EXAM: CT ABDOMEN AND PELVIS WITH CONTRAST TECHNIQUE: Multidetector CT imaging of the abdomen and pelvis was performed using the standard protocol following bolus administration of intravenous contrast. CONTRAST:  100mL ISOVUE-300 IOPAMIDOL (ISOVUE-300) INJECTION 61% COMPARISON:  CT abdomen and pelvis 05/14/2015 FINDINGS: Lower chest:  No pleural effusion.  No pulmonary nodules or masses. Hepatobiliary: No discrete liver lesions. Normal appearance of the gallbladder. No biliary ductal dilatation. Pancreas: Normal appearance of the pancreas. No pancreatic ductal dilatation. Spleen: Normal Adrenals/Urinary Tract: Adrenal glands are  normal. Normal kidneys. No hydronephrosis or ureterectasis. Stomach/Bowel: No evidence of colonic or enteric inflammation. No dilated loops of bowel. Normal appendix. No free fluid within the abdomen or pelvis. Vascular/Lymphatic: The inferior vena cava, portal vein, splenic vein and superior mesenteric vein are patent. The aorta, bilateral renal arteries, celiac axis and superior mesenteric artery are patent. The visualized iliac vessels are normal. No retroperitoneal, mesenteric or pelvic adenopathy. Reproductive: No free fluid within the pelvis. Uterus and ovaries not clearly seen, possibly surgically absent. Other: None Musculoskeletal: Normal.  No lytic or blastic osseous lesions. IMPRESSION: 1. No acute findings to explain the patient's left upper quadrant abdominal pain. 2. No evidence of small bowel obstruction or inflammatory colitis/enteritis. 3. Normal appearance of the spleen. Electronically Signed   By: Deatra RobinsonKevin  Herman M.D.   On: 12/02/2015 15:37    Procedures Procedures (including critical care time)  Medications Ordered in ED Medications - No data to display   Initial Impression / Assessment and Plan / ED Course  I have reviewed the triage vital signs and the nursing notes.  Pertinent labs & imaging results that were available during my care of the patient were reviewed by me and considered in my medical decision making (see chart for details).  Clinical Course    45yF with L anterior chest wall pain. She is concerned for splenic infarct because of her past history. On my exam she is tender over chest wall just beneath her L breast. Abdominal exam is benign. No concerning skin lesions noted. I doubt this is ACS. Doubt PE. I feel ED work-up yesterday was thorough. I do not feel she needs additional testing aside from CT which I think is reasonable.   Imaging ok. Continued symptomatic tx of pleurisy or CW pain.   Final Clinical Impressions(s) / ED Diagnoses   Final diagnoses:    Intercostal pain    New Prescriptions I personally preformed the services scribed in my presence. The recorded information has been reviewed is accurate. Raeford RazorStephen Abdurahman Rugg, MD.     Raeford RazorStephen Indie Nickerson, MD 12/04/15 [phone]

## 2017-07-04 ENCOUNTER — Other Ambulatory Visit: Payer: Self-pay

## 2017-07-04 ENCOUNTER — Emergency Department (HOSPITAL_BASED_OUTPATIENT_CLINIC_OR_DEPARTMENT_OTHER)
Admission: EM | Admit: 2017-07-04 | Discharge: 2017-07-05 | Disposition: A | Discharge status: Home / Self Care | Payer: BLUE CROSS/BLUE SHIELD | Attending: Emergency Medicine | Admitting: Emergency Medicine

## 2017-07-04 ENCOUNTER — Encounter (HOSPITAL_BASED_OUTPATIENT_CLINIC_OR_DEPARTMENT_OTHER): Payer: Self-pay | Admitting: *Deleted

## 2017-07-04 DIAGNOSIS — F17210 Nicotine dependence, cigarettes, uncomplicated: Secondary | ICD-10-CM | POA: Insufficient documentation

## 2017-07-04 DIAGNOSIS — I10 Essential (primary) hypertension: Secondary | ICD-10-CM | POA: Diagnosis not present

## 2017-07-04 DIAGNOSIS — Z79899 Other long term (current) drug therapy: Secondary | ICD-10-CM | POA: Insufficient documentation

## 2017-07-04 DIAGNOSIS — F319 Bipolar disorder, unspecified: Secondary | ICD-10-CM | POA: Diagnosis not present

## 2017-07-04 DIAGNOSIS — R040 Epistaxis: Secondary | ICD-10-CM | POA: Insufficient documentation

## 2017-07-04 MED ORDER — OXYMETAZOLINE HCL 0.05 % NA SOLN
1.0000 | Freq: Once | NASAL | Status: AC
  Administered 2017-07-04: 1 via NASAL
  Filled 2017-07-04: qty 15

## 2017-07-04 MED ORDER — SILVER NITRATE-POT NITRATE 75-25 % EX MISC
1.0000 | Freq: Once | CUTANEOUS | Status: AC
  Administered 2017-07-04: 1 via TOPICAL
  Filled 2017-07-04: qty 1

## 2017-07-04 NOTE — ED Notes (Signed)
Bleeding stopped, pt resting.

## 2017-07-04 NOTE — ED Notes (Signed)
Here for intermittent recent nosebleeds. Not bleeding at this time. Reports 5 separate episodes. Last stopped about 4 hours ago. Mentions OSA and uses [facility] sometimes. Also a h/o HTN. Mentions diarrhea x3. Last episode last night, describes as runny. Also reports L FA buring, "thought it might be carpal tunnel".   Alert, NAD, calm, interactive, resps e/u, speaking in clear complete sentences, no dyspnea noted, skin W&D, VSS, (denies: HA, sob, NV, numbness, tingling, dizziness or visual changes).

## 2017-07-04 NOTE — ED Notes (Signed)
EDP at bedside  

## 2017-07-04 NOTE — ED Notes (Signed)
ED Provider at bedside. 

## 2017-07-04 NOTE — ED Notes (Signed)
Pt actively bleeding from both nares. EDP at bedside.

## 2017-07-04 NOTE — ED Provider Notes (Signed)
[facility] EMERGENCY DEPARTMENT Provider Note   CSN: [id number] Arrival date & time: 07/04/17  1935     History   Chief Complaint Chief Complaint  Patient presents with  . Epistaxis    HPI Miranda Bernard is a 47 y.o. female.  HPI  This is a 47 year old female with a history of hypertension, hypercholesterolemia, bipolar disorder, cleft palate status post repair who presents with epistaxis.  Patient reports on and off nosebleeds since Friday.  Mostly out of the left naris.  Denies any upper respiratory symptoms or nose picking.  Patient reports that she woke up the other night and felt like "I was choking on blood."  She is not on any anticoagulants.  Past Medical History:  Diagnosis Date  . Bipolar 1 disorder (HCC)   . Depression   . High cholesterol   . Hypertension   . Splenic infarct     Patient Active Problem List   Diagnosis Date Noted  . Abdominal pain 04/30/2014  . Splenic infarct 04/30/2014  . Hypertension, uncontrolled 04/30/2014  . Numbness on left side 04/30/2014  . Hyperlipidemia 04/30/2014  . Bipolar disorder (HCC) 04/30/2014    Past Surgical History:  Procedure Laterality Date  . ABDOMINAL HYSTERECTOMY    . chieloplasty    . PALATOPLASTY    . TUBAL LIGATION      OB History    No data available       Home Medications    Prior to Admission medications   Medication Sig Start Date End Date Taking? Authorizing Provider  albuterol (PROVENTIL) (2.5 MG/3ML) 0.083% nebulizer solution Take 2.5 mg by nebulization every 6 (six) hours as needed for wheezing or shortness of breath.   Yes [provider]  fluticasone (VERAMYST) 27.5 MCG/SPRAY nasal spray Place 2 sprays into the nose daily.   Yes [provider]  meloxicam (MOBIC) 15 MG tablet Take 15 mg by mouth daily.   Yes [provider]  rosuvastatin (CRESTOR) 10 MG tablet Take 10 mg by mouth daily.   Yes [provider]  amLODipine (NORVASC) 10 MG  tablet Take 1 tablet (10 mg total) by mouth daily. Patient not taking: Reported on 02/13/2015 05/01/14   Elgergawy, Leana Roe, MD  Aspirin-Caffeine [id number] MG PACK Take 1 packet by mouth every 6 (six) hours as needed (For pain.).    [provider]  atorvastatin (LIPITOR) 40 MG tablet Take 40 mg by mouth daily.    [provider]  FLUoxetine (PROZAC) 20 MG tablet Take 20 mg by mouth daily.    [provider]  HYDROcodone-acetaminophen (NORCO/VICODIN) 5-325 MG tablet Take 1 tablet by mouth every 4 (four) hours as needed. 12/01/15   Rolland Porter, MD  ibuprofen (ADVIL,MOTRIN) 600 MG tablet Take 1 tablet (600 mg total) by mouth every 6 (six) hours as needed. 02/13/15   Linwood Dibbles, MD  lamoTRIgine (LAMICTAL) 25 MG tablet Take 3 tablets by mouth daily. 08/06/15   [provider]  lisinopril (PRINIVIL,ZESTRIL) 5 MG tablet Take 5 mg by mouth daily.    [provider]  naproxen (NAPROSYN) 500 MG tablet Take 1 tablet (500 mg total) by mouth 2 (two) times daily. 12/01/15   Rolland Porter, MD  omeprazole (PRILOSEC) 40 MG capsule Take 1 capsule by mouth daily.    [provider]  senna-docusate (SENOKOT-S) 8.6-50 MG per tablet Take 1 tablet by mouth daily. Patient not taking: Reported on 02/13/2015 05/01/14   Elgergawy, Leana Roe, MD  zolpidem Remus Loffler)  10 MG tablet Take 1 tablet by mouth as needed. For sleep    [provider]  zolpidem (AMBIEN) 5 MG tablet Take 5 mg by mouth at bedtime as needed for sleep.  03/21/14 03/21/15  [provider]    Family History Family History  Problem Relation Age of Onset  . CAD Mother   . CAD Father   . CAD Brother   . Stroke Brother     Social History Social History   Tobacco Use  . Smoking status: Current Every Day Smoker    Packs/day: 0.25    Years: 20.00    Pack years: 5.00    Types: Cigarettes  . Smokeless tobacco: Never Used  Substance Use Topics  . Alcohol use: Yes    Comment: occasional  .  Drug use: No     Allergies   Frovatriptan; Imitrex [sumatriptan]; and Wellbutrin [bupropion]   Review of Systems Review of Systems  HENT: Positive for nosebleeds. Negative for congestion and rhinorrhea.   Respiratory: Negative for shortness of breath.   Cardiovascular: Negative for chest pain.  All other systems reviewed and are negative.    Physical Exam Updated Vital Signs BP (!) 168/91   Pulse 71   Temp 98.6 F (37 C) (Oral)   Resp 17   Ht 5\' 3"  (1.6 m)   Wt 79.8 kg (176 lb)   SpO2 99%   BMI 31.18 kg/m   Physical Exam  Constitutional: She is oriented to person, place, and time. She appears well-developed and well-nourished.  HENT:  Head: Normocephalic and atraumatic.  Scarring noted of the cleft with distortion of the cleft and the naris, bilateral enlarged nasal turbinates, friability noted over the medial inferior aspect of the nasal septum on the left  Cardiovascular: Normal rate and regular rhythm.  Pulmonary/Chest: Effort normal. No respiratory distress.  Neurological: She is alert and oriented to person, place, and time.  Skin: Skin is warm and dry.  Psychiatric: She has a normal mood and affect.  Nursing note and vitals reviewed.    ED Treatments / Results  Labs (all labs ordered are listed, but only abnormal results are displayed) Labs Reviewed - No data to display  EKG  EKG Interpretation None       Radiology No results found.  Procedures .Epistaxis Management Date/Time: 07/05/2017 12:41 AM Performed by: Shon Baton, MD Authorized by: Shon Baton, MD   Consent:    Consent obtained:  Verbal   Consent given by:  Patient   Risks discussed:  Bleeding   Alternatives discussed:  No treatment Anesthesia (see MAR for exact dosages):    Anesthesia method:  None Procedure details:    Treatment site:  L anterior   Treatment method:  Silver nitrate   Treatment complexity:  Limited   Treatment episode: initial   Post-procedure  details:    Assessment:  Bleeding stopped   Patient tolerance of procedure:  Tolerated well, no immediate complications   (including critical care time)  Medications Ordered in ED Medications  oxymetazoline (AFRIN) 0.05 % nasal spray 1 spray (1 spray Each Nare Given 07/04/17 2322)  silver nitrate applicators applicator 1 Stick (1 Stick Topical Given 07/04/17 2324)     Initial Impression / Assessment and Plan / ED Course  I have reviewed the triage vital signs and the nursing notes.  Pertinent labs & imaging results that were available during my care of the patient were reviewed by me and considered in my medical decision  making (see chart for details).     She presents with epistaxis.  She has some friability left nasal septum.  Afrin given and silver nitrate applied.  No recurrent bleeding noted.  Recommend humidifier.  Follow-up with ENT as needed.  After history, exam, and medical workup I feel the patient has been appropriately medically screened and is safe for discharge home. Pertinent diagnoses were discussed with the patient. Patient was given return precautions.   Final Clinical Impressions(s) / ED Diagnoses   Final diagnoses:  Epistaxis    ED Discharge Orders    None       Shon BatonHorton, Treniece Holsclaw F, MD 07/05/17 [phone]

## 2017-07-05 NOTE — ED Notes (Signed)
Pt given d/c instructions as per chart. Verbalizes understanding. No questions. 

## 2017-07-05 NOTE — ED Notes (Signed)
Patient called back stating her nose was bleeding.  Asked if she had used the Afrin and she said "yes".  Per Dr. Wilkie AyeHorton suggested that the patient try to manage the bleeding at home if it was profuse and call the ENT in the morning.  If she couldn't manage it at home, she could come back.

## 2017-07-05 NOTE — ED Notes (Signed)
Patient called stating that her nose was bleeding again.   Per instruction from Dr. Wilkie AyeHorton, patient was told to blow her nose to get the clots out and use  Afrin.

## 2019-09-26 ENCOUNTER — Emergency Department (HOSPITAL_BASED_OUTPATIENT_CLINIC_OR_DEPARTMENT_OTHER): Payer: BC Managed Care – PPO

## 2019-09-26 ENCOUNTER — Other Ambulatory Visit: Payer: Self-pay

## 2019-09-26 ENCOUNTER — Inpatient Hospital Stay (HOSPITAL_BASED_OUTPATIENT_CLINIC_OR_DEPARTMENT_OTHER)
Admission: EM | Admit: 2019-09-26 | Discharge: 2019-09-29 | DRG: 178 | Disposition: A | Discharge status: Home / Self Care | Payer: BC Managed Care – PPO | Attending: Family Medicine | Admitting: Family Medicine

## 2019-09-26 ENCOUNTER — Encounter (HOSPITAL_BASED_OUTPATIENT_CLINIC_OR_DEPARTMENT_OTHER): Payer: Self-pay | Admitting: Emergency Medicine

## 2019-09-26 DIAGNOSIS — I5022 Chronic systolic (congestive) heart failure: Secondary | ICD-10-CM | POA: Diagnosis present

## 2019-09-26 DIAGNOSIS — F319 Bipolar disorder, unspecified: Secondary | ICD-10-CM | POA: Diagnosis present

## 2019-09-26 DIAGNOSIS — O2230 Deep phlebothrombosis in pregnancy, unspecified trimester: Secondary | ICD-10-CM | POA: Diagnosis not present

## 2019-09-26 DIAGNOSIS — I2699 Other pulmonary embolism without acute cor pulmonale: Secondary | ICD-10-CM | POA: Diagnosis not present

## 2019-09-26 DIAGNOSIS — R0602 Shortness of breath: Secondary | ICD-10-CM | POA: Diagnosis not present

## 2019-09-26 DIAGNOSIS — F17210 Nicotine dependence, cigarettes, uncomplicated: Secondary | ICD-10-CM | POA: Diagnosis present

## 2019-09-26 DIAGNOSIS — Z791 Long term (current) use of non-steroidal anti-inflammatories (NSAID): Secondary | ICD-10-CM

## 2019-09-26 DIAGNOSIS — Z79899 Other long term (current) drug therapy: Secondary | ICD-10-CM

## 2019-09-26 DIAGNOSIS — N390 Urinary tract infection, site not specified: Secondary | ICD-10-CM | POA: Diagnosis present

## 2019-09-26 DIAGNOSIS — E876 Hypokalemia: Secondary | ICD-10-CM | POA: Diagnosis present

## 2019-09-26 DIAGNOSIS — R6510 Systemic inflammatory response syndrome (SIRS) of non-infectious origin without acute organ dysfunction: Secondary | ICD-10-CM | POA: Diagnosis present

## 2019-09-26 DIAGNOSIS — R109 Unspecified abdominal pain: Secondary | ICD-10-CM | POA: Diagnosis present

## 2019-09-26 DIAGNOSIS — K219 Gastro-esophageal reflux disease without esophagitis: Secondary | ICD-10-CM | POA: Diagnosis present

## 2019-09-26 DIAGNOSIS — B9620 Unspecified Escherichia coli [E. coli] as the cause of diseases classified elsewhere: Secondary | ICD-10-CM | POA: Diagnosis present

## 2019-09-26 DIAGNOSIS — Z8249 Family history of ischemic heart disease and other diseases of the circulatory system: Secondary | ICD-10-CM | POA: Diagnosis not present

## 2019-09-26 DIAGNOSIS — R091 Pleurisy: Secondary | ICD-10-CM | POA: Diagnosis present

## 2019-09-26 DIAGNOSIS — L4050 Arthropathic psoriasis, unspecified: Secondary | ICD-10-CM | POA: Diagnosis present

## 2019-09-26 DIAGNOSIS — Z888 Allergy status to other drugs, medicaments and biological substances status: Secondary | ICD-10-CM

## 2019-09-26 DIAGNOSIS — K760 Fatty (change of) liver, not elsewhere classified: Secondary | ICD-10-CM | POA: Diagnosis present

## 2019-09-26 DIAGNOSIS — J690 Pneumonitis due to inhalation of food and vomit: Secondary | ICD-10-CM | POA: Diagnosis present

## 2019-09-26 DIAGNOSIS — R000 Tachycardia, unspecified: Secondary | ICD-10-CM | POA: Diagnosis present

## 2019-09-26 DIAGNOSIS — Z20822 Contact with and (suspected) exposure to covid-19: Secondary | ICD-10-CM | POA: Diagnosis present

## 2019-09-26 DIAGNOSIS — J189 Pneumonia, unspecified organism: Secondary | ICD-10-CM | POA: Diagnosis present

## 2019-09-26 DIAGNOSIS — Z90710 Acquired absence of both cervix and uterus: Secondary | ICD-10-CM | POA: Diagnosis not present

## 2019-09-26 DIAGNOSIS — I110 Hypertensive heart disease with heart failure: Secondary | ICD-10-CM | POA: Diagnosis present

## 2019-09-26 DIAGNOSIS — E785 Hyperlipidemia, unspecified: Secondary | ICD-10-CM | POA: Diagnosis present

## 2019-09-26 LAB — SARS CORONAVIRUS 2 BY RT PCR (HOSPITAL ORDER, PERFORMED IN ~~LOC~~ HOSPITAL LAB): SARS Coronavirus 2: NEGATIVE

## 2019-09-26 LAB — URINALYSIS, ROUTINE W REFLEX MICROSCOPIC
Bilirubin Urine: NEGATIVE
Glucose, UA: NEGATIVE mg/dL
Ketones, ur: 15 mg/dL — AB
Leukocytes,Ua: NEGATIVE
Nitrite: NEGATIVE
Protein, ur: NEGATIVE mg/dL
Specific Gravity, Urine: <1.005 — ABNORMAL LOW (ref 1.005–1.030)
pH: 7 (ref 5.0–8.0)

## 2019-09-26 LAB — CBC WITH DIFFERENTIAL/PLATELET
Abs Immature Granulocytes: 0.04 10*3/uL (ref 0.00–0.07)
Basophils Absolute: 0 10*3/uL (ref 0.0–0.1)
Basophils Relative: 0 %
Eosinophils Absolute: 0 10*3/uL (ref 0.0–0.5)
Eosinophils Relative: 0 %
HCT: 43.9 % (ref 36.0–46.0)
Hemoglobin: 15.6 g/dL — ABNORMAL HIGH (ref 12.0–15.0)
Immature Granulocytes: 0 %
Lymphocytes Relative: 11 %
Lymphs Abs: 1.3 10*3/uL (ref 0.7–4.0)
MCH: 41.3 pg — ABNORMAL HIGH (ref 26.0–34.0)
MCHC: 35.5 g/dL (ref 30.0–36.0)
MCV: 116.1 fL — ABNORMAL HIGH (ref 80.0–100.0)
Monocytes Absolute: 0.9 10*3/uL (ref 0.1–1.0)
Monocytes Relative: 8 %
Neutro Abs: 9.2 10*3/uL — ABNORMAL HIGH (ref 1.7–7.7)
Neutrophils Relative %: 81 %
Platelets: 227 10*3/uL (ref 150–400)
RBC: 3.78 MIL/uL — ABNORMAL LOW (ref 3.87–5.11)
RDW: 12.6 % (ref 11.5–15.5)
WBC: 11.6 10*3/uL — ABNORMAL HIGH (ref 4.0–10.5)
nRBC: 0 % (ref 0.0–0.2)

## 2019-09-26 LAB — URINALYSIS, MICROSCOPIC (REFLEX)

## 2019-09-26 LAB — SEDIMENTATION RATE: Sed Rate: 1 mm/hr (ref 0–22)

## 2019-09-26 LAB — COMPREHENSIVE METABOLIC PANEL
ALT: 14 U/L (ref 0–44)
AST: 19 U/L (ref 15–41)
Albumin: 4.3 g/dL (ref 3.5–5.0)
Alkaline Phosphatase: 71 U/L (ref 38–126)
Anion gap: 12 (ref 5–15)
BUN: 8 mg/dL (ref 6–20)
CO2: 22 mmol/L (ref 22–32)
Calcium: 9.1 mg/dL (ref 8.9–10.3)
Chloride: 100 mmol/L (ref 98–111)
Creatinine, Ser: 0.71 mg/dL (ref 0.44–1.00)
GFR calc Af Amer: >60 mL/min (ref >60)
GFR calc non Af Amer: >60 mL/min (ref >60)
Glucose, Bld: 120 mg/dL — ABNORMAL HIGH (ref 70–99)
Potassium: 3.7 mmol/L (ref 3.5–5.1)
Sodium: 134 mmol/L — ABNORMAL LOW (ref 135–145)
Total Bilirubin: 2 mg/dL — ABNORMAL HIGH (ref 0.3–1.2)
Total Protein: 7.5 g/dL (ref 6.5–8.1)

## 2019-09-26 LAB — BRAIN NATRIURETIC PEPTIDE: B Natriuretic Peptide: 81.8 pg/mL (ref 0.0–100.0)

## 2019-09-26 LAB — GLUCOSE, CAPILLARY: Glucose-Capillary: 120 mg/dL — ABNORMAL HIGH (ref 70–99)

## 2019-09-26 LAB — HEPARIN LEVEL (UNFRACTIONATED): Heparin Unfractionated: 0.23 IU/mL — ABNORMAL LOW (ref 0.30–0.70)

## 2019-09-26 LAB — TSH: TSH: 1.283 u[IU]/mL (ref 0.350–4.500)

## 2019-09-26 LAB — HIV ANTIBODY (ROUTINE TESTING W REFLEX): HIV Screen 4th Generation wRfx: NONREACTIVE

## 2019-09-26 LAB — TROPONIN I (HIGH SENSITIVITY): Troponin I (High Sensitivity): 9 ng/L (ref <18)

## 2019-09-26 LAB — C-REACTIVE PROTEIN: CRP: 15.7 mg/dL — ABNORMAL HIGH (ref <1.0)

## 2019-09-26 LAB — CK: Total CK: 60 U/L (ref 38–234)

## 2019-09-26 MED ORDER — SENNOSIDES-DOCUSATE SODIUM 8.6-50 MG PO TABS
1.0000 | ORAL_TABLET | Freq: Every day | ORAL | Status: DC
  Administered 2019-09-27: 1 via ORAL
  Filled 2019-09-26 (×3): qty 1

## 2019-09-26 MED ORDER — SODIUM CHLORIDE 0.9 % IV BOLUS
1000.0000 mL | Freq: Once | INTRAVENOUS | Status: AC
  Administered 2019-09-26: 1000 mL via INTRAVENOUS

## 2019-09-26 MED ORDER — ATORVASTATIN CALCIUM 40 MG PO TABS
40.0000 mg | ORAL_TABLET | Freq: Every day | ORAL | Status: DC
  Administered 2019-09-27 – 2019-09-29 (×3): 40 mg via ORAL
  Filled 2019-09-26 (×3): qty 1

## 2019-09-26 MED ORDER — FLUTICASONE PROPIONATE 50 MCG/ACT NA SUSP
2.0000 | Freq: Every day | NASAL | Status: DC
  Filled 2019-09-26: qty 16

## 2019-09-26 MED ORDER — IOHEXOL 350 MG/ML SOLN
100.0000 mL | Freq: Once | INTRAVENOUS | Status: AC | PRN
  Administered 2019-09-26: 100 mL via INTRAVENOUS

## 2019-09-26 MED ORDER — LAMOTRIGINE 25 MG PO TABS
75.0000 mg | ORAL_TABLET | Freq: Every day | ORAL | Status: DC
  Administered 2019-09-26 – 2019-09-29 (×4): 75 mg via ORAL
  Filled 2019-09-26 (×4): qty 3

## 2019-09-26 MED ORDER — ALBUTEROL SULFATE (2.5 MG/3ML) 0.083% IN NEBU: 2.5000 mg | INHALATION_SOLUTION | Freq: Four times a day (QID) | RESPIRATORY_TRACT | Status: DC | PRN

## 2019-09-26 MED ORDER — MORPHINE SULFATE (PF) 4 MG/ML IV SOLN
4.0000 mg | Freq: Once | INTRAVENOUS | Status: AC
  Administered 2019-09-26: 4 mg via INTRAVENOUS
  Filled 2019-09-26: qty 1

## 2019-09-26 MED ORDER — LISINOPRIL 10 MG PO TABS
10.0000 mg | ORAL_TABLET | Freq: Every day | ORAL | Status: AC
  Administered 2019-09-26: 10 mg via ORAL
  Filled 2019-09-26: qty 1

## 2019-09-26 MED ORDER — FLUOXETINE HCL 20 MG PO CAPS
20.0000 mg | ORAL_CAPSULE | Freq: Every day | ORAL | Status: DC
  Administered 2019-09-27 – 2019-09-29 (×3): 20 mg via ORAL
  Filled 2019-09-26 (×3): qty 1

## 2019-09-26 MED ORDER — ROSUVASTATIN CALCIUM 5 MG PO TABS: 10.0000 mg | ORAL_TABLET | Freq: Every day | ORAL | Status: DC

## 2019-09-26 MED ORDER — FENTANYL CITRATE (PF) 100 MCG/2ML IJ SOLN: 50.0000 ug | Freq: Four times a day (QID) | INTRAMUSCULAR | Status: DC | PRN

## 2019-09-26 MED ORDER — ALBUTEROL SULFATE (2.5 MG/3ML) 0.083% IN NEBU: 2.5000 mg | INHALATION_SOLUTION | RESPIRATORY_TRACT | Status: DC | PRN

## 2019-09-26 MED ORDER — ACETAMINOPHEN 325 MG PO TABS
650.0000 mg | ORAL_TABLET | Freq: Four times a day (QID) | ORAL | Status: DC | PRN
  Administered 2019-09-26 – 2019-09-29 (×7): 650 mg via ORAL
  Filled 2019-09-26 (×7): qty 2

## 2019-09-26 MED ORDER — FENTANYL CITRATE (PF) 100 MCG/2ML IJ SOLN
50.0000 ug | Freq: Once | INTRAMUSCULAR | Status: AC
  Administered 2019-09-26: 50 ug via INTRAVENOUS
  Filled 2019-09-26: qty 2

## 2019-09-26 MED ORDER — HEPARIN BOLUS VIA INFUSION
3000.0000 [IU] | Freq: Once | INTRAVENOUS | Status: AC
  Administered 2019-09-26: 3000 [IU] via INTRAVENOUS

## 2019-09-26 MED ORDER — PANTOPRAZOLE SODIUM 40 MG PO TBEC
40.0000 mg | DELAYED_RELEASE_TABLET | Freq: Every day | ORAL | Status: DC
  Administered 2019-09-27 – 2019-09-29 (×3): 40 mg via ORAL
  Filled 2019-09-26 (×3): qty 1

## 2019-09-26 MED ORDER — ACETAMINOPHEN 650 MG RE SUPP: 650.0000 mg | Freq: Four times a day (QID) | RECTAL | Status: DC | PRN

## 2019-09-26 MED ORDER — IOHEXOL 300 MG/ML  SOLN
100.0000 mL | Freq: Once | INTRAMUSCULAR | Status: AC | PRN
  Administered 2019-09-26: 100 mL via INTRAVENOUS

## 2019-09-26 MED ORDER — HYDRALAZINE HCL 20 MG/ML IJ SOLN: 10.0000 mg | Freq: Four times a day (QID) | INTRAMUSCULAR | Status: DC | PRN

## 2019-09-26 MED ORDER — HEPARIN (PORCINE) 25000 UT/250ML-% IV SOLN
1050.0000 [IU]/h | INTRAVENOUS | Status: DC
  Administered 2019-09-26: 900 [IU]/h via INTRAVENOUS
  Administered 2019-09-27: 1050 [IU]/h via INTRAVENOUS
  Filled 2019-09-26 (×2): qty 250

## 2019-09-26 MED ORDER — OXYCODONE-ACETAMINOPHEN 7.5-325 MG PO TABS
1.0000 | ORAL_TABLET | Freq: Four times a day (QID) | ORAL | Status: DC | PRN
  Administered 2019-09-26 – 2019-09-28 (×4): 1 via ORAL
  Filled 2019-09-26 (×4): qty 1

## 2019-09-26 NOTE — Progress Notes (Signed)
ANTICOAGULATION CONSULT NOTE - Initial Consult  Pharmacy Consult for heparin Indication: pulmonary embolus  Allergies  Allergen Reactions  . Frovatriptan Other (See Comments)    Joint pain  . Imitrex [Sumatriptan] Other (See Comments)    "Felt like I was having spasms"  . Wellbutrin [Bupropion] Nausea And Vomiting    Patient Measurements: Height: 5\' 3"  (160 cm) Weight: 56.7 kg (125 lb) IBW/kg (Calculated) : 52.4 Heparin Dosing Weight: 57  Vital Signs: Temp: 98.6 F (37 C) (06/08 0943) Temp Source: Oral (06/08 0943) BP: 177/108 (06/08 1236) Pulse Rate: 97 (06/08 1236)  Labs: Recent Labs    09/26/19 1021  HGB 15.6*  HCT 43.9  PLT 227  CREATININE 0.71    Estimated Creatinine Clearance: 69.6 mL/min (by C-G formula based on SCr of 0.71 mg/dL).   Medical History: Past Medical History:  Diagnosis Date  . Bipolar 1 disorder (HCC)   . Depression   . High cholesterol   . Hypertension   . Splenic infarct     Medications:  (Not in a hospital admission)   Assessment: new onset upper flank pain, starting last night, consistent with PE   Goal of Therapy:  Heparin level 0.3-0.7 units/ml Monitor platelets by anticoagulation protocol: Yes   Plan:  Give 3000 units bolus x 1 Start heparin infusion at 900 units/hr Check anti-Xa level in 6  hours and daily while on heparin Continue to monitor H&H and platelets  11/26/19 A Faylynn Stamos 09/26/2019,3:52 PM

## 2019-09-26 NOTE — Progress Notes (Signed)
ANTICOAGULATION CONSULT NOTE - Initial Consult  Pharmacy Consult for Heparin Indication: pulmonary embolus  Allergies  Allergen Reactions  . Frovatriptan Other (See Comments)    Joint pain  . Imitrex [Sumatriptan] Other (See Comments)    "Felt like I was having spasms"  . Wellbutrin [Bupropion] Nausea And Vomiting    Patient Measurements: Height: 5\' 3"  (160 cm) Weight: 56.7 kg (125 lb) IBW/kg (Calculated) : 52.4 Heparin Dosing Weight: 57  Vital Signs: Temp: 99.5 F (37.5 C) (06/08 2156) Temp Source: Oral (06/08 2156) BP: 157/81 (06/08 2156) Pulse Rate: 101 (06/08 2156)  Labs: Recent Labs    09/26/19 1021 09/26/19 2038 09/26/19 2233  HGB 15.6*  --   --   HCT 43.9  --   --   PLT 227  --   --   HEPARINUNFRC  --   --  0.23*  CREATININE 0.71  --   --   CKTOTAL  --  60  --   TROPONINIHS  --  9  --     Estimated Creatinine Clearance: 69.6 mL/min (by C-G formula based on SCr of 0.71 mg/dL).   Medical History: Past Medical History:  Diagnosis Date  . Bipolar 1 disorder (HCC)   . Depression   . High cholesterol   . Hypertension   . Splenic infarct     Medications:  Medications Prior to Admission  Medication Sig Dispense Refill Last Dose  . Adalimumab 40 MG/0.4ML PNKT Inject 40 mg into the skin every 7 (seven) days.    Past Week at Unknown time  . albuterol (PROVENTIL) (2.5 MG/3ML) 0.083% nebulizer solution Take 2.5 mg by nebulization every 6 (six) hours as needed for wheezing or shortness of breath.   unknown at unknown  . lisinopril (PRINIVIL,ZESTRIL) 5 MG tablet Take 5 mg by mouth daily.   09/25/2019 at Unknown time  . omeprazole (PRILOSEC) 40 MG capsule Take 1 capsule by mouth in the morning and at bedtime.    Past Week at Unknown time  . amLODipine (NORVASC) 10 MG tablet Take 1 tablet (10 mg total) by mouth daily. (Patient not taking: Reported on 02/13/2015) 30 tablet 0 Not Taking at Unknown time  . HYDROcodone-acetaminophen (NORCO/VICODIN) 5-325 MG tablet Take  1 tablet by mouth every 4 (four) hours as needed. (Patient not taking: Reported on 09/26/2019) 10 tablet 0 Not Taking at Unknown time  . ibuprofen (ADVIL,MOTRIN) 600 MG tablet Take 1 tablet (600 mg total) by mouth every 6 (six) hours as needed. (Patient not taking: Reported on 09/26/2019) 30 tablet 0 Not Taking at Unknown time  . naproxen (NAPROSYN) 500 MG tablet Take 1 tablet (500 mg total) by mouth 2 (two) times daily. (Patient not taking: Reported on 09/26/2019) 30 tablet 0 Not Taking at Unknown time  . rosuvastatin (CRESTOR) 10 MG tablet Take 10 mg by mouth daily.   Not Taking at Unknown time  . senna-docusate (SENOKOT-S) 8.6-50 MG per tablet Take 1 tablet by mouth daily. (Patient not taking: Reported on 02/13/2015)   Not Taking at Unknown time    Assessment: new onset upper flank pain, starting last night, consistent with PE  6/8 PM update:  Heparin level low  Goal of Therapy:  Heparin level 0.3-0.7 units/ml Monitor platelets by anticoagulation protocol: Yes   Plan:  Inc heparin to 1050 units/hr Re-check heparin level in 6-8 hours  8/8, PharmD, BCPS Clinical Pharmacist Phone: [phone]

## 2019-09-26 NOTE — ED Notes (Signed)
Attempted to call report to [facility]; provided contact info for this RN for call back.

## 2019-09-26 NOTE — Plan of Care (Addendum)
Transfer from [facility] of HTN, HLD, GERD, bipolar disorder, and previous splenic infarct presented with complaints of left flank pain.  Denies any recent trauma.  Taking naproxen without improvement in symptoms.  Found initially to be mildly tachycardic.  Labs significant for WBC 11.6.  UA positive for many bacteria moderate hemoglobin.  CT of the abdomen and pelvis noted concern for possible pulmonary infarction.  CT angiogram of the chest was not able to be bleeding due to complaints of left arm pain.  Discussed case with the PA who we will start her on a heparin drip.  Case was discussed with the radiologist who recommended to reobtain a CT angiogram with contrast on 6/9.  Excepted to a medical telemetry bed here at [facility].

## 2019-09-26 NOTE — ED Notes (Signed)
Patient transported to CT 

## 2019-09-26 NOTE — ED Triage Notes (Addendum)
Pt c/o acute onset left upper flank pain with left side back pain that pt reports as starting last night. Pt denies injury. Pt endorses pain with inspiration. Pt endorses 2 naproxen at 0600 today with little relief. Pt denies fever, denies dysuria

## 2019-09-26 NOTE — Progress Notes (Signed)
New admit arrived to unit in yellow- awaiting phys to input orders- on call phys was notified- and orders received- medications administered as soon ASAP- shortly after medications taken she vomited- was given another dose of  Tylenol- the prn BP med did not meet parameters

## 2019-09-26 NOTE — ED Provider Notes (Signed)
[facility] EMERGENCY DEPARTMENT Provider Note   CSN: [id number] Arrival date & time: 09/26/19  [id number]     History Chief Complaint  Patient presents with  . Flank Pain    Miranda Bernard is a 49 y.o. female with past medical history notable for HTN, HLD, GERD, bipolar disorder, and splenic infarct who presents to the ED with complaints of atraumatic left flank discomfort that began last night at 9 PM.  She has been taking naproxen, without relief.  Pain is 10 out of 10.  She states that she spent the majority of yesterday in bed because she has been feeling nauseated with diminished appetite.  She has received her COVID-19 vaccines.  She states that her significant left flank discomfort is causing associated shortness of breath, particularly with deep inspiration.  No history of PE.  No recent unilateral leg swelling.  She states that this feels comparable to her prior splenic infarct.  She denies any fevers or chills, chest pain, cough, vomiting, dysuria, increased urinary frequency, history of stones, or pelvic symptoms.  She had a hysterectomy a few years ago.  HPI     Past Medical History:  Diagnosis Date  . Bipolar 1 disorder (HCC)   . Depression   . High cholesterol   . Hypertension   . Splenic infarct     Patient Active Problem List   Diagnosis Date Noted  . Abdominal pain 04/30/2014  . Splenic infarct 04/30/2014  . Hypertension, uncontrolled 04/30/2014  . Numbness on left side 04/30/2014  . Hyperlipidemia 04/30/2014  . Bipolar disorder (HCC) 04/30/2014    Past Surgical History:  Procedure Laterality Date  . ABDOMINAL HYSTERECTOMY    . chieloplasty    . PALATOPLASTY    . TUBAL LIGATION       OB History   No obstetric history on file.     Family History  Problem Relation Age of Onset  . CAD Mother   . CAD Father   . CAD Brother   . Stroke Brother     Social History   Tobacco Use  . Smoking status: Current Every Day Smoker    Packs/day:  0.25    Years: 20.00    Pack years: 5.00    Types: Cigarettes  . Smokeless tobacco: Never Used  Substance Use Topics  . Alcohol use: Yes    Comment: occasional  . Drug use: No    Home Medications Prior to Admission medications   Medication Sig Start Date End Date Taking? Authorizing Provider  albuterol (PROVENTIL) (2.5 MG/3ML) 0.083% nebulizer solution Take 2.5 mg by nebulization every 6 (six) hours as needed for wheezing or shortness of breath.    [provider]  amLODipine (NORVASC) 10 MG tablet Take 1 tablet (10 mg total) by mouth daily. Patient not taking: Reported on 02/13/2015 05/01/14   Elgergawy, Leana Roeawood S, MD  Aspirin-Caffeine [id number] MG PACK Take 1 packet by mouth every 6 (six) hours as needed (For pain.).    [provider]  atorvastatin (LIPITOR) 40 MG tablet Take 40 mg by mouth daily.    [provider]  FLUoxetine (PROZAC) 20 MG tablet Take 20 mg by mouth daily.    [provider]  fluticasone (VERAMYST) 27.5 MCG/SPRAY nasal spray Place 2 sprays into the nose daily.    [provider]  HYDROcodone-acetaminophen (NORCO/VICODIN) 5-325 MG tablet Take 1 tablet by mouth every 4 (four) hours as needed. 12/01/15   Rolland PorterJames, Mark, MD  ibuprofen (ADVIL,MOTRIN) 600  MG tablet Take 1 tablet (600 mg total) by mouth every 6 (six) hours as needed. 02/13/15   Dorie Rank, MD  lamoTRIgine (LAMICTAL) 25 MG tablet Take 3 tablets by mouth daily. 08/06/15   [provider]  lisinopril (PRINIVIL,ZESTRIL) 5 MG tablet Take 5 mg by mouth daily.    [provider]  meloxicam (MOBIC) 15 MG tablet Take 15 mg by mouth daily.    [provider]  naproxen (NAPROSYN) 500 MG tablet Take 1 tablet (500 mg total) by mouth 2 (two) times daily. 12/01/15   Tanna Furry, MD  omeprazole (PRILOSEC) 40 MG capsule Take 1 capsule by mouth daily.    [provider]  rosuvastatin (CRESTOR) 10 MG tablet Take 10 mg by mouth daily.    [provider]  senna-docusate (SENOKOT-S) 8.6-50 MG per tablet Take 1 tablet by mouth daily. Patient not taking: Reported on 02/13/2015 05/01/14   Elgergawy, Silver Huguenin, MD  zolpidem (AMBIEN) 10 MG tablet Take 1 tablet by mouth as needed. For sleep    [provider]  zolpidem (AMBIEN) 5 MG tablet Take 5 mg by mouth at bedtime as needed for sleep.  03/21/14 03/21/15  [provider]    Allergies    Frovatriptan, Imitrex [sumatriptan], and Wellbutrin [bupropion]  Review of Systems   Review of Systems  All other systems reviewed and are negative.   Physical Exam Updated Vital Signs BP (!) 177/108   Pulse 97   Temp 98.6 F (37 C) (Oral)   Resp 20   Ht 5\' 3"  (1.6 m)   Wt 56.7 kg   SpO2 100%   BMI 22.14 kg/m   Physical Exam Vitals and nursing note reviewed. Exam conducted with a chaperone present.  Constitutional:      Appearance: Normal appearance.  HENT:     Head: Normocephalic and atraumatic.  Eyes:     General: No scleral icterus.    Conjunctiva/sclera: Conjunctivae normal.  Cardiovascular:     Rate and Rhythm: Regular rhythm. Tachycardia present.     Pulses: Normal pulses.  Pulmonary:     Effort: Pulmonary effort is normal. No respiratory distress.     Breath sounds: Normal breath sounds.  Abdominal:     Comments: Soft, nondistended.  Palpation in abdomen elicits discomfort in area of left flank.  No guarding.  No overlying skin changes.  Significant left-sided CVAT.  No right-sided CVAT.  Musculoskeletal:        General: Normal range of motion.     Cervical back: Normal range of motion. No rigidity.  Skin:    General: Skin is dry.     Capillary Refill: Capillary refill takes less than 2 seconds.  Neurological:     Mental Status: She is alert and oriented to person, place, and time.     GCS: GCS eye subscore is 4. GCS verbal subscore is 5. GCS motor subscore is 6.  Psychiatric:        Mood and Affect: Mood normal.        Behavior: Behavior normal.         Thought Content: Thought content normal.     ED Results / Procedures / Treatments   Labs (all labs ordered are listed, but only abnormal results are displayed) Labs Reviewed  COMPREHENSIVE METABOLIC PANEL - Abnormal; Notable for the following components:      Result Value   Sodium 134 (*)    Glucose, Bld 120 (*)    Total Bilirubin 2.0 (*)  All other components within normal limits  CBC WITH DIFFERENTIAL/PLATELET - Abnormal; Notable for the following components:   WBC 11.6 (*)    RBC 3.78 (*)    Hemoglobin 15.6 (*)    MCV 116.1 (*)    MCH 41.3 (*)    Neutro Abs 9.2 (*)    All other components within normal limits  URINALYSIS, ROUTINE W REFLEX MICROSCOPIC - Abnormal; Notable for the following components:   Specific Gravity, Urine <1.005 (*)    Hgb urine dipstick MODERATE (*)    Ketones, ur 15 (*)    All other components within normal limits  URINALYSIS, MICROSCOPIC (REFLEX) - Abnormal; Notable for the following components:   Bacteria, UA MANY (*)    All other components within normal limits  SARS CORONAVIRUS 2 BY RT PCR (HOSPITAL ORDER, PERFORMED IN  HOSPITAL LAB)    EKG None  Radiology CT Angio Chest PE W/Cm &/Or Wo Cm  Result Date: 09/26/2019 CLINICAL DATA:  Shortness of breath, concern for pulmonary infarction EXAM: CT ANGIOGRAPHY CHEST WITH CONTRAST TECHNIQUE: Multidetector CT imaging of the chest was performed using the standard protocol during bolus administration of intravenous contrast. Multiplanar CT image reconstructions and MIPs were obtained to evaluate the vascular anatomy. CONTRAST:  OMNIPAQUE IOHEXOL 350 MG/ML SOLN. Power injection was initially attempted, but aborted due to concern for contrast extravasation; approximately 75 mL of contrast was injected on initial attempt. Due to previous administration of 100 mL contrast for CT of the abdomen pelvis, and additional volume of contrast was not administered, and the remainder of  approximately 25 mL of contrast was injected in a second attempt. CT examination of the chest was acquired, however contrast bolus was nondiagnostic as discussed below. COMPARISON:  Same day CT abdomen pelvis FINDINGS: Cardiovascular: Contrast bolus is nondiagnostic for pulmonary embolism, with preferential opacification of the thoracic aorta. Normal heart size. No pericardial effusion. Mediastinum/Nodes: No enlarged mediastinal, hilar, or axillary lymph nodes. Thyroid gland, trachea, and esophagus demonstrate no significant findings. Lungs/Pleura: There is a dense, rounded subpleural opacity in the dependent left lung base with a surrounding ground-glass halo, central portion measuring approximately 4.0 cm (series 6, image 50) Upper Abdomen: No acute abnormality. Musculoskeletal: No chest wall abnormality. No acute or significant osseous findings. Review of the MIP images confirms the above findings. IMPRESSION: 1. Contrast bolus is nondiagnostic for pulmonary embolism, primarily due to technical failure described above. Given the overall clinical concern for pulmonary embolus, technical repeat examination with administration of a third dose of contrast was discussed with ordering provider Walden Statz, PA. 2. A dense, rounded subpleural opacity in the dependent left lung base is again seen, concerning for pulmonary infarction given appearance, although round pneumonia or aspiration could have this appearance. These results were called by telephone at the time of interpretation on 09/26/2019 at 2:51 pm to provider PA Skyeler Smola , who verbally acknowledged these results. Electronically Signed   By: Lauralyn Primes M.D.   On: 09/26/2019 14:52   CT ABDOMEN PELVIS W CONTRAST  Result Date: 09/26/2019 CLINICAL DATA:  Left flank pain since yesterday EXAM: CT ABDOMEN AND PELVIS WITH CONTRAST TECHNIQUE: Multidetector CT imaging of the abdomen and pelvis was performed using the standard protocol following bolus administration of  intravenous contrast. CONTRAST:  OMNIPAQUE IOHEXOL 300 MG/ML SOLN, additional oral enteric contrast COMPARISON:  None. FINDINGS: Lower chest: There is a partially imaged, rounded subpleural opacity of the dependent left lung base with a halo of ground-glass (series 4, image  1). Hepatobiliary: No solid liver abnormality is seen. Unchanged focal fatty deposition adjacent to the ligamentum venosum. No gallstones, gallbladder wall thickening, or biliary dilatation. Pancreas: Unremarkable. No pancreatic ductal dilatation or surrounding inflammatory changes. Spleen: Normal in size without significant abnormality. Adrenals/Urinary Tract: Adrenal glands are unremarkable. Kidneys are normal, without renal calculi, solid lesion, or hydronephrosis. Bladder is unremarkable. Stomach/Bowel: Stomach is within normal limits. Appendix appears normal. No evidence of bowel wall thickening, distention, or inflammatory changes. Vascular/Lymphatic: No significant vascular findings are present. No enlarged abdominal or pelvic lymph nodes. Reproductive: Status post hysterectomy. Other: No abdominal wall hernia or abnormality. No abdominopelvic ascites. Musculoskeletal: No acute or significant osseous findings. IMPRESSION: 1. No acute CT findings within the abdomen to explain left flank pain. No evidence of urinary tract calculus or hydronephrosis. 2. There is a partially imaged, rounded subpleural opacity of the dependent left lung base with a surrounding halo of ground-glass, this appearance highly suspicious for pulmonary infarction and generally referable to reported symptoms. Nonspecific differential considerations include infection or aspiration. There is no embolus identified in the lower chest, however only the subsegmental arteries of the lung bases are included on this examination of the abdomen and pelvis. Consider CT pulmonary angiogram to further evaluate. 3.  Aortic Atherosclerosis (ICD10-I70.0). Electronically Signed    By: Lauralyn Primes M.D.   On: 09/26/2019 12:01    Procedures Procedures (including critical care time)  Medications Ordered in ED Medications  sodium chloride 0.9 % bolus 1,000 mL ( Intravenous Stopped 09/26/19 1147)  fentaNYL (SUBLIMAZE) injection 50 mcg (50 mcg Intravenous Given 09/26/19 1024)  iohexol (OMNIPAQUE) 300 MG/ML solution 100 mL (100 mLs Intravenous Contrast Given 09/26/19 1111)  morphine 4 MG/ML injection 4 mg (4 mg Intravenous Given 09/26/19 1232)  iohexol (OMNIPAQUE) 350 MG/ML injection 100 mL (100 mLs Intravenous Contrast Given 09/26/19 1308)  sodium chloride 0.9 % bolus 1,000 mL (1,000 mLs Intravenous New Bag/Given 09/26/19 1505)    ED Course  I have reviewed the triage vital signs and the nursing notes.  Pertinent labs & imaging results that were available during my care of the patient were reviewed by me and considered in my medical decision making (see chart for details).    MDM Rules/Calculators/A&P                      Patient's history and physical exam is concerning for splenic infarct versus pulmonary embolism versus left-sided kidney stone.  Will obtain CT abdomen and pelvis with contrast.  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  2D echo showed ejection fraction of 45%, but no embolic source.  Cultures were negative.  Patient was ultimately discharged home on 81 mg aspirin daily.   Orders: Fentanyl 50 mcg Morphine 4 mg 2 L NS  Labs: CBC with differential --mild leukocytosis to 11.6. CMP -- elevated bilirubin of 2.0, but no other significant abnormalities. UA -- moderate blood in Hgb and mild ketones.  Not concerning for infection.  Patient is hypertensive here in the ED, likely explained by her 10 out of 10 discomfort in conjunction with the fact that she did not take her antihypertensives this morning.  The injection on IV was halted  prematurely during CTA chest and patient had already received contrast earlier for CT abdomen and pelvis, but the CTA was attempted anyway.  I spoke with the radiologist, Dr. Jayme Cloud, who advised that  we perform a technical repeat CTA study.  Discussed case at length with Dr. Judd Lien and we feel as though patient would benefit from admission for observation and continued pain management.  Plan is for a technical repeat of CTA chest to be performed tomorrow for better delineation of patient's area on CT concerning for infarct/embolism vs infection.   Spoke with Dr. Katrinka Blazing, hospitalist.  Will start patient on IV heparin and obtain COVID-19 testing.  Dr. Katrinka Blazing will accept patient for ongoing evaluation and management.  Final Clinical Impression(s) / ED Diagnoses Final diagnoses:  Left flank pain    Rx / DC Orders ED Discharge Orders    None       Lorelee New, PA-C 09/26/19 1542    Geoffery Lyons, MD 09/27/19 [phone]

## 2019-09-26 NOTE — Progress Notes (Signed)
   09/26/19 2028  Charting Type  Charting Type Reassessment  Neurological  Neuro (WDL) WDL  Respiratory  Respiratory (WDL) WDL  Cardiac  Cardiac (WDL) WDL  ECG Monitoring  PR interval 0.16  QRS interval 0.06  QT interval 0.27  QTc interval 0.35  ECG Heart Rate (!) 106  Cardiac Rhythm ST  Vascular  Vascular (WDL) WDL  Neurological  Level of Consciousness Alert

## 2019-09-26 NOTE — H&P (Signed)
History and Physical    Miranda Bernard QIH:[id number] DOB: [birth year] DOA: 09/26/2019  PCP: Lavell Islam, MD  Patient coming from: home  I have personally briefly reviewed patient's old medical records in [facility]  Chief Complaint:  Flank pain  HPI: Miranda Bernard is a 49 y.o. female with medical history significant of   HTN, HLD, GERD, bipolar disorder,psoriasis on humira and previous splenic infarct presentes with complaints of left flank pain. Per patient flank pain began  9pm the prior evening acutely. She describes the pain as sharp   without radiation . She notes at its highest intensity it was a 10/10. She states she tried naproxen at home without any relief.  She also noted associated symptoms of nausea with decreased appetite. She denies any associated chest pain, shortness of breath, abdominal pain, fever, chills,cough, urinary frequency or dysuria She notes that she has  had symptoms like this in the past and at that time she was diagnosed with splenic infarct. Per chart cause of splenic infarct was noted as unknown and patient was discharged on daily asa. Patient currently states that she does not take asa daily at this time and does not remember talking a daily aspirin after her discharge at that time. She denies a history of kidney stones, prior history of PE or DVT. She does note on further ros that she has lost 80 lbs which she believe is partly due to poor appetite, she notes that due to weight loss she underwent a GI evaluation with no obvious source found for her weight loss. She notes her last mamogram was one year ago and it was normal at  That time. Currently she denies any diarrhea, dark stools or bleeding.She notes stable psoriatic changes especially on her lower extremities and chronic arthritic pain. ED Course:  BP (!) 177/108   Pulse 97   Temp 98.6 F (37 C) (Oral)   Resp 20   Ht 5\' 3"  (1.6 m)   Wt 56.7 kg   SpO2 100%   BMI 22.14 kg/m  Pain noted as  a 10/10   Labs: wbc 11.6, hgb 15.6, MC 116.1,pmn 9.2, U A + bacteria , +blood cmp bili 2 , cr 0.71  Na 134, CT chest noted ? Area of infarct/embolism vs infection ( see full details below) tx with ivfs, fentanyl ,morphine  Review of Systems: As per HPI otherwise 10 point review of systems negative.   Past Medical History:  Diagnosis Date  . Bipolar 1 disorder (HCC)   . Depression   . High cholesterol   . Hypertension   . Splenic infarct        Psoriatic Arthritis   Past Surgical History:  Procedure Laterality Date  . ABDOMINAL HYSTERECTOMY    . chieloplasty    . PALATOPLASTY    . TUBAL LIGATION       reports that she has been smoking cigarettes. She has a 5.00 pack-year smoking history. She has never used smokeless tobacco. She reports current alcohol use. She reports that she does not use drugs.  Allergies  Allergen Reactions  . Frovatriptan Other (See Comments)    Joint pain  . Imitrex [Sumatriptan] Other (See Comments)    "Felt like I was having spasms"  . Wellbutrin [Bupropion] Nausea And Vomiting    Family History  Problem Relation Age of Onset  . CAD Mother   . CAD Father   . CAD Brother   . Stroke Brother  Breast Cancer                                           Aunt       DMII                                                           Brother   Prior to Admission medications   Medication Sig Start Date End Date Taking? Authorizing Provider  albuterol (PROVENTIL) (2.5 MG/3ML) 0.083% nebulizer solution Take 2.5 mg by nebulization every 6 (six) hours as needed for wheezing or shortness of breath.    [provider]  amLODipine (NORVASC) 10 MG tablet Take 1 tablet (10 mg total) by mouth daily. Patient not taking: Reported on 02/13/2015 05/01/14   Elgergawy, Leana Roeawood S, MD  Aspirin-Caffeine [phone] MG PACK Take 1 packet by mouth every 6 (six) hours as needed (For pain.).    [provider]  atorvastatin (LIPITOR) 40 MG tablet Take 40 mg by  mouth daily.    [provider]  FLUoxetine (PROZAC) 20 MG tablet Take 20 mg by mouth daily.    [provider]  fluticasone (VERAMYST) 27.5 MCG/SPRAY nasal spray Place 2 sprays into the nose daily.    [provider]  HYDROcodone-acetaminophen (NORCO/VICODIN) 5-325 MG tablet Take 1 tablet by mouth every 4 (four) hours as needed. 12/01/15   Rolland PorterJames, Mark, MD  ibuprofen (ADVIL,MOTRIN) 600 MG tablet Take 1 tablet (600 mg total) by mouth every 6 (six) hours as needed. 02/13/15   Linwood DibblesKnapp, Jon, MD  lamoTRIgine (LAMICTAL) 25 MG tablet Take 3 tablets by mouth daily. 08/06/15   [provider]  lisinopril (PRINIVIL,ZESTRIL) 5 MG tablet Take 5 mg by mouth daily.    [provider]  meloxicam (MOBIC) 15 MG tablet Take 15 mg by mouth daily.    [provider]  naproxen (NAPROSYN) 500 MG tablet Take 1 tablet (500 mg total) by mouth 2 (two) times daily. 12/01/15   Rolland PorterJames, Mark, MD  omeprazole (PRILOSEC) 40 MG capsule Take 1 capsule by mouth daily.    [provider]  rosuvastatin (CRESTOR) 10 MG tablet Take 10 mg by mouth daily.    [provider]  senna-docusate (SENOKOT-S) 8.6-50 MG per tablet Take 1 tablet by mouth daily. Patient not taking: Reported on 02/13/2015 05/01/14   Elgergawy, Leana Roeawood S, MD  zolpidem (AMBIEN) 10 MG tablet Take 1 tablet by mouth as needed. For sleep    [provider]  zolpidem (AMBIEN) 5 MG tablet Take 5 mg by mouth at bedtime as needed for sleep.  03/21/14 03/21/15  [provider]    Physical Exam: Vitals:   09/26/19 1236 09/26/19 1611 09/26/19 1700 09/26/19 1749  BP: (!) 177/108 (!) 172/92 (!) 180/99 (!) 191/94  Pulse: 97 (!) 106 (!) 105 (!) 109  Resp: 20 15 16 15   Temp:      TempSrc:      SpO2: 100% 100% 98% 99%  Weight:      Height:        Constitutional: NAD, calm, comfortable Vitals:   09/26/19 1236 09/26/19 1611 09/26/19 1700 09/26/19 1749  BP: (!) 177/108 (!) 172/92 (!) 180/99 Marland Kitchen(!)  191/94  Pulse: 97 (!) 106 (!) 105 (!) 109  Resp: 20 15 16 15   Temp:      TempSrc:      SpO2: 100% 100% 98% 99%  Weight:      Height:       Eyes: PERRL, lids and conjunctivae normal ENMT: Mucous membranes are moist. Posterior pharynx clear of any exudate or lesions.Normal dentition.  Neck: normal, supple, no masses, no thyromegaly Respiratory: clear to auscultation bilaterally, no wheezing, no crackles. Normal respiratory effort. No accessory muscle use.  Cardiovascular: tachycardic and regular rhythm, no murmurs / rubs / gallops. No extremity edema. 2+ pedal pulses. Abdomen: no tenderness, no masses palpated. No hepatosplenomegaly. Bowel sounds positive.  Musculoskeletal: no clubbing / cyanosis. No joint deformity upper and lower extremities. Good ROM, no contractures. Normal muscle tone.  Skin: + rashes/scars related to psoriasis,otherwise no lesions, ulcers. No induration Neurologic: CN 2-12 grossly intact. Sensation intact, DTR normal. Strength 5/5 in all 4.  Psychiatric: Normal judgment and insight. Alert and oriented x 3. Normal mood.    Labs on Admission: I have personally reviewed following labs and imaging studies  CBC: Recent Labs  Lab 09/26/19 1021  WBC 11.6*  NEUTROABS 9.2*  HGB 15.6*  HCT 43.9  MCV 116.1*  PLT 875   Basic Metabolic Panel: Recent Labs  Lab 09/26/19 1021  NA 134*  K 3.7  CL 100  CO2 22  GLUCOSE 120*  BUN 8  CREATININE 0.71  CALCIUM 9.1   GFR: Estimated Creatinine Clearance: 69.6 mL/min (by C-G formula based on SCr of 0.71 mg/dL). Liver Function Tests: Recent Labs  Lab 09/26/19 1021  AST 19  ALT 14  ALKPHOS 71  BILITOT 2.0*  PROT 7.5  ALBUMIN 4.3   No results for input(s): LIPASE, AMYLASE in the last 168 hours. No results for input(s): AMMONIA in the last 168 hours. Coagulation Profile: No results for input(s): INR, PROTIME in the last 168 hours. Cardiac Enzymes: No results for input(s): CKTOTAL, CKMB, CKMBINDEX, TROPONINI in  the last 168 hours. BNP (last 3 results) No results for input(s): PROBNP in the last 8760 hours. HbA1C: No results for input(s): HGBA1C in the last 72 hours. CBG: No results for input(s): GLUCAP in the last 168 hours. Lipid Profile: No results for input(s): CHOL, HDL, LDLCALC, TRIG, CHOLHDL, LDLDIRECT in the last 72 hours. Thyroid Function Tests: No results for input(s): TSH, T4TOTAL, FREET4, T3FREE, THYROIDAB in the last 72 hours. Anemia Panel: No results for input(s): VITAMINB12, FOLATE, FERRITIN, TIBC, IRON, RETICCTPCT in the last 72 hours. Urine analysis:    Component Value Date/Time   COLORURINE YELLOW 09/26/2019 1131   APPEARANCEUR CLEAR 09/26/2019 1131   LABSPEC <1.005 (L) 09/26/2019 1131   PHURINE 7.0 09/26/2019 1131   GLUCOSEU NEGATIVE 09/26/2019 1131   HGBUR MODERATE (A) 09/26/2019 1131   BILIRUBINUR NEGATIVE 09/26/2019 1131   KETONESUR 15 (A) 09/26/2019 1131   PROTEINUR NEGATIVE 09/26/2019 1131   UROBILINOGEN 1.0 10/24/2006 2136   NITRITE NEGATIVE 09/26/2019 1131   LEUKOCYTESUR NEGATIVE 09/26/2019 1131    Radiological Exams on Admission: CT Angio Chest PE W/Cm &/Or Wo Cm  Result Date: 09/26/2019 CLINICAL DATA:  Shortness of breath, concern for pulmonary infarction EXAM: CT ANGIOGRAPHY CHEST WITH CONTRAST TECHNIQUE: Multidetector CT imaging of the chest was performed using the standard protocol during bolus administration of intravenous contrast. Multiplanar CT image reconstructions and MIPs were obtained to evaluate the vascular anatomy. CONTRAST:  140mL OMNIPAQUE IOHEXOL 350 MG/ML SOLN. Power injection was  initially attempted, but aborted due to concern for contrast extravasation; approximately 75 mL of contrast was injected on initial attempt. Due to previous administration of 100 mL contrast for CT of the abdomen pelvis, and additional volume of contrast was not administered, and the remainder of approximately 25 mL of contrast was injected in a second attempt. CT  examination of the chest was acquired, however contrast bolus was nondiagnostic as discussed below. COMPARISON:  Same day CT abdomen pelvis FINDINGS: Cardiovascular: Contrast bolus is nondiagnostic for pulmonary embolism, with preferential opacification of the thoracic aorta. Normal heart size. No pericardial effusion. Mediastinum/Nodes: No enlarged mediastinal, hilar, or axillary lymph nodes. Thyroid gland, trachea, and esophagus demonstrate no significant findings. Lungs/Pleura: There is a dense, rounded subpleural opacity in the dependent left lung base with a surrounding ground-glass halo, central portion measuring approximately 4.0 cm (series 6, image 50) Upper Abdomen: No acute abnormality. Musculoskeletal: No chest wall abnormality. No acute or significant osseous findings. Review of the MIP images confirms the above findings. IMPRESSION: 1. Contrast bolus is nondiagnostic for pulmonary embolism, primarily due to technical failure described above. Given the overall clinical concern for pulmonary embolus, technical repeat examination with administration of a third dose of contrast was discussed with ordering provider Green, PA. 2. A dense, rounded subpleural opacity in the dependent left lung base is again seen, concerning for pulmonary infarction given appearance, although round pneumonia or aspiration could have this appearance. These results were called by telephone at the time of interpretation on 09/26/2019 at 2:51 pm to provider PA GARRETT GREEN , who verbally acknowledged these results. Electronically Signed   By: Lauralyn Primes M.D.   On: 09/26/2019 14:52   CT ABDOMEN PELVIS W CONTRAST  Result Date: 09/26/2019 CLINICAL DATA:  Left flank pain since yesterday EXAM: CT ABDOMEN AND PELVIS WITH CONTRAST TECHNIQUE: Multidetector CT imaging of the abdomen and pelvis was performed using the standard protocol following bolus administration of intravenous contrast. CONTRAST:  OMNIPAQUE IOHEXOL 300 MG/ML  SOLN, additional oral enteric contrast COMPARISON:  None. FINDINGS: Lower chest: There is a partially imaged, rounded subpleural opacity of the dependent left lung base with a halo of ground-glass (series 4, image 1). Hepatobiliary: No solid liver abnormality is seen. Unchanged focal fatty deposition adjacent to the ligamentum venosum. No gallstones, gallbladder wall thickening, or biliary dilatation. Pancreas: Unremarkable. No pancreatic ductal dilatation or surrounding inflammatory changes. Spleen: Normal in size without significant abnormality. Adrenals/Urinary Tract: Adrenal glands are unremarkable. Kidneys are normal, without renal calculi, solid lesion, or hydronephrosis. Bladder is unremarkable. Stomach/Bowel: Stomach is within normal limits. Appendix appears normal. No evidence of bowel wall thickening, distention, or inflammatory changes. Vascular/Lymphatic: No significant vascular findings are present. No enlarged abdominal or pelvic lymph nodes. Reproductive: Status post hysterectomy. Other: No abdominal wall hernia or abnormality. No abdominopelvic ascites. Musculoskeletal: No acute or significant osseous findings. IMPRESSION: 1. No acute CT findings within the abdomen to explain left flank pain. No evidence of urinary tract calculus or hydronephrosis. 2. There is a partially imaged, rounded subpleural opacity of the dependent left lung base with a surrounding halo of ground-glass, this appearance highly suspicious for pulmonary infarction and generally referable to reported symptoms. Nonspecific differential considerations include infection or aspiration. There is no embolus identified in the lower chest, however only the subsegmental arteries of the lung bases are included on this examination of the abdomen and pelvis. Consider CT pulmonary angiogram to further evaluate. 3.  Aortic Atherosclerosis (ICD10-I70.0). Electronically Signed   By: Trinna Post  Jayme Cloud M.D.   On: 09/26/2019 12:01    EKG:  Independently reviewed. ekg pending  Assessment/Plan  Left side flank pain due to Pleurisy from pulmonary process suspect pul infarct/PE -continue iv heparin  -initial CTPA with technically limited  -repeat CTPA in am as per radiology recommendation   -prior history of splenic infarct / now with concern for possible clots in setting of weight loss -consider hematology consult in am -supportive care for associated pain  - pulmonary toilet    Leukocytosis -concern for infection , patient w/o fever  - infectious work noted only bacteria in Ua , will send cultures , patient is asx -possible pulmonary finding also related to infection, but patient without significant pulmonary symptoms  -will check inflammatory markers, monitor wbc count and fever curve  - no antibiotics at this time will monitor closely for clinical change and addition as needed   HTN  -elevated due to pain  On baseline HTN -treat pain  -resume lisinopril    Bipolar disorder  -resume lamictal  -currently well compensated   HLD  -not taking statin currently  - liver notes some fatty infiltration  - will check lipid panel   ? Hx CHF with EF of 45% on last echo  - will repeat echo in am  -patient not on treatment   Psoriatic Arthritis  -per patient states she is on Humira  - not on medication list  -pharmacy consult for further med rec  FEN Mild hyponatremia  Monitor labs  DVT prophylaxis: heparin drip Code Status: full  Family Communication:n/a   Disposition Plan: patient expected to be admit past midnight  Consults called:please call hematology consult  In am  Admission status: tele  Lurline Del MD [facility]  If 7PM-7AM, please contact night-coverage [email or web address] Password [facility]  09/26/2019, 7:46 PM

## 2019-09-26 NOTE — Progress Notes (Signed)
   09/26/19 1956  Assess: MEWS Score  Temp (!) 102.2 F (39 C)  BP (!) 165/93  Pulse Rate (!) 105  Resp 18  Level of Consciousness Alert  SpO2 99 %  O2 Device Room Air  Assess: MEWS Score  MEWS Temp 2  MEWS Systolic 0  MEWS Pulse 1  MEWS RR 0  MEWS LOC 0  MEWS Score 3  MEWS Score Color Yellow  Assess: if the MEWS score is Yellow or Red  Were vital signs taken at a resting state? Yes  Focused Assessment Documented focused assessment  Early Detection of Sepsis Score *See Row Information* Low  MEWS guidelines implemented *See Row Information* Yes  Treat  MEWS Interventions Administered scheduled meds/treatments;Administered prn meds/treatments  Take Vital Signs  Increase Vital Sign Frequency  Yellow: Q 2hr X 2 then Q 4hr X 2, if remains yellow, continue Q 4hrs  Escalate  MEWS: Escalate Yellow: discuss with charge nurse/RN and consider discussing with provider and RRT  Notify: Charge Nurse/RN  Name of Charge Nurse/RN Notified Alona Bene  Date Charge Nurse/RN Notified 09/26/19  Time Charge Nurse/RN Notified 2000  Document  Patient Outcome Other (Comment)  Progress note created (see row info) Yes

## 2019-09-27 ENCOUNTER — Inpatient Hospital Stay (HOSPITAL_COMMUNITY): Payer: BC Managed Care – PPO

## 2019-09-27 DIAGNOSIS — O2230 Deep phlebothrombosis in pregnancy, unspecified trimester: Secondary | ICD-10-CM

## 2019-09-27 DIAGNOSIS — I2699 Other pulmonary embolism without acute cor pulmonale: Secondary | ICD-10-CM

## 2019-09-27 DIAGNOSIS — R0602 Shortness of breath: Secondary | ICD-10-CM

## 2019-09-27 DIAGNOSIS — J690 Pneumonitis due to inhalation of food and vomit: Principal | ICD-10-CM

## 2019-09-27 DIAGNOSIS — R109 Unspecified abdominal pain: Secondary | ICD-10-CM

## 2019-09-27 LAB — COMPREHENSIVE METABOLIC PANEL
ALT: 13 U/L (ref 0–44)
AST: 21 U/L (ref 15–41)
Albumin: 3.4 g/dL — ABNORMAL LOW (ref 3.5–5.0)
Alkaline Phosphatase: 61 U/L (ref 38–126)
Anion gap: 11 (ref 5–15)
BUN: 6 mg/dL (ref 6–20)
CO2: 23 mmol/L (ref 22–32)
Calcium: 8.7 mg/dL — ABNORMAL LOW (ref 8.9–10.3)
Chloride: 101 mmol/L (ref 98–111)
Creatinine, Ser: 0.74 mg/dL (ref 0.44–1.00)
GFR calc Af Amer: >60 mL/min (ref >60)
GFR calc non Af Amer: >60 mL/min (ref >60)
Glucose, Bld: 110 mg/dL — ABNORMAL HIGH (ref 70–99)
Potassium: 4.2 mmol/L (ref 3.5–5.1)
Sodium: 135 mmol/L (ref 135–145)
Total Bilirubin: 2.3 mg/dL — ABNORMAL HIGH (ref 0.3–1.2)
Total Protein: 6.4 g/dL — ABNORMAL LOW (ref 6.5–8.1)

## 2019-09-27 LAB — CBC
HCT: 41.7 % (ref 36.0–46.0)
Hemoglobin: 14.7 g/dL (ref 12.0–15.0)
MCH: 41.6 pg — ABNORMAL HIGH (ref 26.0–34.0)
MCHC: 35.3 g/dL (ref 30.0–36.0)
MCV: 118.1 fL — ABNORMAL HIGH (ref 80.0–100.0)
Platelets: 178 10*3/uL (ref 150–400)
RBC: 3.53 MIL/uL — ABNORMAL LOW (ref 3.87–5.11)
RDW: 12.3 % (ref 11.5–15.5)
WBC: 10.9 10*3/uL — ABNORMAL HIGH (ref 4.0–10.5)
nRBC: 0 % (ref 0.0–0.2)

## 2019-09-27 LAB — TROPONIN I (HIGH SENSITIVITY): Troponin I (High Sensitivity): 9 ng/L (ref <18)

## 2019-09-27 LAB — GLUCOSE, CAPILLARY: Glucose-Capillary: 189 mg/dL — ABNORMAL HIGH (ref 70–99)

## 2019-09-27 LAB — PROCALCITONIN: Procalcitonin: 0.39 ng/mL

## 2019-09-27 LAB — HEPARIN LEVEL (UNFRACTIONATED)
Heparin Unfractionated: 0.48 IU/mL (ref 0.30–0.70)
Heparin Unfractionated: 0.37 IU/mL (ref 0.30–0.70)

## 2019-09-27 MED ORDER — IOHEXOL 350 MG/ML SOLN
100.0000 mL | Freq: Once | INTRAVENOUS | Status: AC | PRN
  Administered 2019-09-27: 60 mL via INTRAVENOUS

## 2019-09-27 MED ORDER — SODIUM CHLORIDE 0.9 % IV SOLN
3.0000 g | Freq: Four times a day (QID) | INTRAVENOUS | Status: DC
  Filled 2019-09-27 (×2): qty 8

## 2019-09-27 MED ORDER — SODIUM CHLORIDE 0.9 % IV SOLN
3.0000 g | Freq: Four times a day (QID) | INTRAVENOUS | Status: DC
  Administered 2019-09-27: 3 g via INTRAVENOUS
  Filled 2019-09-27 (×4): qty 8

## 2019-09-27 MED ORDER — ONDANSETRON HCL 4 MG/2ML IJ SOLN
4.0000 mg | Freq: Four times a day (QID) | INTRAMUSCULAR | Status: DC | PRN
  Administered 2019-09-27: 4 mg via INTRAVENOUS
  Filled 2019-09-27: qty 2

## 2019-09-27 MED ORDER — SODIUM CHLORIDE 0.9 % IV SOLN
3.0000 g | Freq: Four times a day (QID) | INTRAVENOUS | Status: DC
  Administered 2019-09-27 – 2019-09-29 (×7): 3 g via INTRAVENOUS
  Filled 2019-09-27 (×9): qty 8

## 2019-09-27 MED ORDER — ENOXAPARIN SODIUM 40 MG/0.4ML ~~LOC~~ SOLN
40.0000 mg | SUBCUTANEOUS | Status: DC
  Filled 2019-09-27 (×2): qty 0.4

## 2019-09-27 NOTE — Progress Notes (Signed)
Left lower extremity venous duplex has been completed. Preliminary results can be found in CV Proc through chart review.   09/27/19 2:20 PM Olen Cordial RVT

## 2019-09-27 NOTE — Plan of Care (Signed)

## 2019-09-27 NOTE — Plan of Care (Signed)

## 2019-09-27 NOTE — Progress Notes (Signed)
Pharmacy Antibiotic Note  Miranda Bernard is a 49 y.o. female admitted on 09/26/2019 with left flank pain - unclear etiology, pulmonary infarction versus aspiration pneumonia.  Pharmacy has been consulted for Unasyn dosing.  Febrile and WBC elevated Serum creatinine < 1 and estimated creatinine clearance > 50 ml/min  Plan: Unasyn 3 g IV q6h  Height: 5\' 3"  (160 cm) Weight: 56.7 kg (125 lb) IBW/kg (Calculated) : 52.4  Temp (24hrs), Avg:100.2 F (37.9 C), Min:98.6 F (37 C), Max:102.2 F (39 C)  Recent Labs  Lab 09/26/19 1021 09/27/19 0402  WBC 11.6* 10.9*  CREATININE 0.71 0.74    Estimated Creatinine Clearance: 69.6 mL/min (by C-G formula based on SCr of 0.74 mg/dL).    Allergies  Allergen Reactions  . Frovatriptan Other (See Comments)    Joint pain  . Imitrex [Sumatriptan] Other (See Comments)    "Felt like I was having spasms"  . Wellbutrin [Bupropion] Nausea And Vomiting    Antimicrobials this admission: 6/9 Unasyn >>   Dose adjustments this admission: n/a  Microbiology results: 6/8 BCx: pending 6/8 UCx: sent  Thank you for allowing pharmacy to be a part of this patient's care.  8/8, PharmD, BCPS Clinical Pharmacist 09/27/2019 11:55 AM

## 2019-09-27 NOTE — Progress Notes (Signed)
  Echocardiogram 2D Echocardiogram has been performed.  Miranda Bernard 09/27/2019, 9:21 AM

## 2019-09-27 NOTE — Progress Notes (Signed)
ANTICOAGULATION CONSULT NOTE - Initial Consult  Pharmacy Consult for Heparin Indication: pulmonary embolus  Allergies  Allergen Reactions  . Frovatriptan Other (See Comments)    Joint pain  . Imitrex [Sumatriptan] Other (See Comments)    "Felt like I was having spasms"  . Wellbutrin [Bupropion] Nausea And Vomiting    Patient Measurements: Height: 5\' 3"  (160 cm) Weight: 56.7 kg (125 lb) IBW/kg (Calculated) : 52.4 Heparin Dosing Weight: 57 kg  Vital Signs: Temp: 101.4 F (38.6 C) (06/09 0807) Temp Source: Oral (06/09 0807) BP: 148/78 (06/09 0807) Pulse Rate: 91 (06/09 0807)  Labs: Recent Labs    09/26/19 1021 09/26/19 2038 09/26/19 2233 09/27/19 0402 09/27/19 0758  HGB 15.6*  --   --  14.7  --   HCT 43.9  --   --  41.7  --   PLT 227  --   --  178  --   HEPARINUNFRC  --   --  0.23*  --  0.48  CREATININE 0.71  --   --  0.74  --   CKTOTAL  --  60  --   --   --   TROPONINIHS  --  9 9  --   --     Estimated Creatinine Clearance: 69.6 mL/min (by C-G formula based on SCr of 0.74 mg/dL).   Medical History: Past Medical History:  Diagnosis Date  . Bipolar 1 disorder (HCC)   . Depression   . High cholesterol   . Hypertension   . Splenic infarct     Medications:  Medications Prior to Admission  Medication Sig Dispense Refill Last Dose  . Adalimumab 40 MG/0.4ML PNKT Inject 40 mg into the skin every 7 (seven) days.    Past Week at Unknown time  . albuterol (PROVENTIL) (2.5 MG/3ML) 0.083% nebulizer solution Take 2.5 mg by nebulization every 6 (six) hours as needed for wheezing or shortness of breath.   unknown at unknown  . lisinopril (PRINIVIL,ZESTRIL) 5 MG tablet Take 5 mg by mouth daily.   09/25/2019 at Unknown time  . omeprazole (PRILOSEC) 40 MG capsule Take 1 capsule by mouth in the morning and at bedtime.    Past Week at Unknown time  . amLODipine (NORVASC) 10 MG tablet Take 1 tablet (10 mg total) by mouth daily. (Patient not taking: Reported on 02/13/2015) 30  tablet 0 Not Taking at Unknown time  . HYDROcodone-acetaminophen (NORCO/VICODIN) 5-325 MG tablet Take 1 tablet by mouth every 4 (four) hours as needed. (Patient not taking: Reported on 09/26/2019) 10 tablet 0 Not Taking at Unknown time  . ibuprofen (ADVIL,MOTRIN) 600 MG tablet Take 1 tablet (600 mg total) by mouth every 6 (six) hours as needed. (Patient not taking: Reported on 09/26/2019) 30 tablet 0 Not Taking at Unknown time  . naproxen (NAPROSYN) 500 MG tablet Take 1 tablet (500 mg total) by mouth 2 (two) times daily. (Patient not taking: Reported on 09/26/2019) 30 tablet 0 Not Taking at Unknown time  . rosuvastatin (CRESTOR) 10 MG tablet Take 10 mg by mouth daily.   Not Taking at Unknown time  . senna-docusate (SENOKOT-S) 8.6-50 MG per tablet Take 1 tablet by mouth daily. (Patient not taking: Reported on 02/13/2015)   Not Taking at Unknown time    Assessment: New onset upper flank pain, starting last night, consistent with PE.  First heparin level was subtherapeutic and heparin infusion was increased from 900 units/hr to 1050 units/hr.    This morning, second heparin level therapeutic with heparin  infusing at 1050 units/hr No bleeding or infusion related issues noted.  Goal of Therapy:  Heparin level 0.3-0.7 units/ml Monitor platelets by anticoagulation protocol: Yes   Plan:  Continue heparin infusion at 1050 units/hr Obtain confirmatory heparin level in 6 hours Monitor daily heparin level, CBC, s/s bleeding   Shela Commons, PharmD, BCPS Clinical Pharmacist

## 2019-09-27 NOTE — Progress Notes (Signed)
Pt MEWs score back to green, yellow mews protocol has been completed, will continue to monitor.

## 2019-09-27 NOTE — Progress Notes (Signed)
Pt due to have IV abx hung at 1230, pt only had 1 IV being used for heparin. RN attempted IV insertion, but called IV team and one was placed at 1400. At that time transport also called for pt to be taken to CT scan, needed free IV for scan. This RN called pharmacist Efraim Kaufmann and confirmed scheduling for IV abx. Will hang abx as soon as possible, when pt returns from CT. Will continue to monitor until then.

## 2019-09-27 NOTE — Progress Notes (Signed)
ANTICOAGULATION CONSULT NOTE - Initial Consult  Pharmacy Consult for Heparin Indication: pulmonary embolus  Allergies  Allergen Reactions  . Frovatriptan Other (See Comments)    Joint pain  . Imitrex [Sumatriptan] Other (See Comments)    "Felt like I was having spasms"  . Wellbutrin [Bupropion] Nausea And Vomiting    Patient Measurements: Height: 5\' 3"  (160 cm) Weight: 56.7 kg (125 lb) IBW/kg (Calculated) : 52.4 Heparin Dosing Weight: 57 kg  Vital Signs: Temp: 101.4 F (38.6 C) (06/09 0807) Temp Source: Oral (06/09 0807) BP: 148/78 (06/09 0807) Pulse Rate: 91 (06/09 0807)  Labs: Recent Labs    09/26/19 1021 09/26/19 2038 09/26/19 2233 09/27/19 0402 09/27/19 0758 09/27/19 1509  HGB 15.6*  --   --  14.7  --   --   HCT 43.9  --   --  41.7  --   --   PLT 227  --   --  178  --   --   HEPARINUNFRC  --   --  0.23*  --  0.48 0.37  CREATININE 0.71  --   --  0.74  --   --   CKTOTAL  --  60  --   --   --   --   TROPONINIHS  --  9 9  --   --   --     Estimated Creatinine Clearance: 69.6 mL/min (by C-G formula based on SCr of 0.74 mg/dL).   Medical History: Past Medical History:  Diagnosis Date  . Bipolar 1 disorder (HCC)   . Depression   . High cholesterol   . Hypertension   . Splenic infarct     Medications:  Medications Prior to Admission  Medication Sig Dispense Refill Last Dose  . Adalimumab 40 MG/0.4ML PNKT Inject 40 mg into the skin every 7 (seven) days.    Past Week at Unknown time  . albuterol (PROVENTIL) (2.5 MG/3ML) 0.083% nebulizer solution Take 2.5 mg by nebulization every 6 (six) hours as needed for wheezing or shortness of breath.   unknown at unknown  . lisinopril (PRINIVIL,ZESTRIL) 5 MG tablet Take 5 mg by mouth daily.   09/25/2019 at Unknown time  . omeprazole (PRILOSEC) 40 MG capsule Take 1 capsule by mouth in the morning and at bedtime.    Past Week at Unknown time  . amLODipine (NORVASC) 10 MG tablet Take 1 tablet (10 mg total) by mouth daily.  (Patient not taking: Reported on 02/13/2015) 30 tablet 0 Not Taking at Unknown time  . HYDROcodone-acetaminophen (NORCO/VICODIN) 5-325 MG tablet Take 1 tablet by mouth every 4 (four) hours as needed. (Patient not taking: Reported on 09/26/2019) 10 tablet 0 Not Taking at Unknown time  . ibuprofen (ADVIL,MOTRIN) 600 MG tablet Take 1 tablet (600 mg total) by mouth every 6 (six) hours as needed. (Patient not taking: Reported on 09/26/2019) 30 tablet 0 Not Taking at Unknown time  . naproxen (NAPROSYN) 500 MG tablet Take 1 tablet (500 mg total) by mouth 2 (two) times daily. (Patient not taking: Reported on 09/26/2019) 30 tablet 0 Not Taking at Unknown time  . rosuvastatin (CRESTOR) 10 MG tablet Take 10 mg by mouth daily.   Not Taking at Unknown time  . senna-docusate (SENOKOT-S) 8.6-50 MG per tablet Take 1 tablet by mouth daily. (Patient not taking: Reported on 02/13/2015)   Not Taking at Unknown time    Assessment: New onset upper flank pain, starting last night, consistent with PE.  First heparin level was subtherapeutic and  heparin infusion was increased from 900 units/hr to 1050 units/hr.    This afternoon, confirmatory heparin level therapeutic with heparin infusing at 1050 units/hr No bleeding or infusion related issues noted.  Goal of Therapy:  Heparin level 0.3-0.7 units/ml Monitor platelets by anticoagulation protocol: Yes   Plan:  Continue heparin infusion at 1050 units/hr Monitor daily heparin level, CBC, s/s bleeding   Shela Commons, PharmD, BCPS Clinical Pharmacist

## 2019-09-27 NOTE — Progress Notes (Addendum)
[facility]  PROGRESS NOTE  Miranda Bernard WPY:[id number] DOB: 1970-07-27 DOA: 09/26/2019 PCP: Thurman Coyer, MD   Brief HPI:   49 year old female with medical history of hypertension, hyperlipidemia, GERD, bipolar disorder, psoriasis on Humira, previous splenic infarct presents with left flank pain. CT abdomen pelvis and CTA chest were done. CT abdomen pelvis was unremarkable. CT chest showed possibility of round pneumonia versus aspiration pneumonia versus pulmonary infarction. CT chest was inconclusive but pulmonary embolism and another repeat CT chest was recommended in the next 24 hours. Patient was empirically started on IV heparin.    Subjective   Patient seen and examined, febrile this morning.  T-max 102.2.  Heart rate 114.  Repeat CTA chest is pending.   Assessment/Plan:     1. Left flank pain-unclear etiology, pulmonary infarction versus aspiration pneumonia.  Patient has been started empirically on IV heparin for pulmonary embolism.  CTA chest done yesterday was inconclusive for PE, repeat CTA chest has been ordered for today.  We will continue with IV heparin, follow CTA chest results. 2. SIRS-patient developed fever, tachycardia this morning.  There is a concern for possible aspiration pneumonia on CT chest.  Will start on IV Unasyn, obtain procalcitonin.  CRP is elevated at 15.  Follow blood culture results. 3. Hypertension-blood pressure is controlled, continue lisinopril. 4. Bipolar disorder-continue Lamictal 5. Hyperlipidemia-patient is not on statin at home, follow lipid panel. 6. History of CHF with EF 45%-echocardiogram obtained this morning, will follow results. 7. Psoriatic arthritis-patient takes Humira as outpatient.    SpO2: 98 %   COVID-19 Labs  Recent Labs    09/26/19 2038  CRP 15.7*    Lab Results  Component Value Date   SARSCOV2NAA NEGATIVE 09/26/2019     CBG: Recent Labs  Lab 09/26/19 2057 09/27/19 0747  GLUCAP 120* 189*     CBC: Recent Labs  Lab 09/26/19 1021 09/27/19 0402  WBC 11.6* 10.9*  NEUTROABS 9.2*  --   HGB 15.6* 14.7  HCT 43.9 41.7  MCV 116.1* 118.1*  PLT 227 053    Basic Metabolic Panel: Recent Labs  Lab 09/26/19 1021 09/27/19 0402  NA 134* 135  K 3.7 4.2  CL 100 101  CO2 22 23  GLUCOSE 120* 110*  BUN 8 6  CREATININE 0.71 0.74  CALCIUM 9.1 8.7*     Liver Function Tests: Recent Labs  Lab 09/26/19 1021 09/27/19 0402  AST 19 21  ALT 14 13  ALKPHOS 71 61  BILITOT 2.0* 2.3*  PROT 7.5 6.4*  ALBUMIN 4.3 3.4*        DVT prophylaxis: IV heparin  Code Status: Full code  Family Communication: No family at bedside    Status is: Inpatient  Dispo: The patient is from: Home              Anticipated d/c is to: Home              Anticipated d/c date is: 09/28/2019              Patient currently not medically stable for discharge  Barrier to discharge-ongoing work-up for left pulmonary infarction,?  Aspiration pneumonia.  Also on IV heparin for possible pulmonary embolism.        Scheduled medications:  . atorvastatin  40 mg Oral Daily  . FLUoxetine  20 mg Oral Daily  . fluticasone  2 spray Each Nare Daily  . lamoTRIgine  75 mg Oral Daily  . pantoprazole  40 mg Oral  Daily  . senna-docusate  1 tablet Oral Daily    Consultants:  None  Procedures:  None  Antibiotics:   Anti-infectives (From admission, onward)   None       Objective   Vitals:   09/26/19 2156 09/26/19 2356 09/27/19 0326 09/27/19 0807  BP: (!) 157/81 137/88 (!) 145/81 (!) 148/78  Pulse: (!) 101 99 87 91  Resp: 18 17 16 17   Temp: 99.5 F (37.5 C) 99.1 F (37.3 C) 98.6 F (37 C) (!) 101.4 F (38.6 C)  TempSrc: Oral Oral Oral Oral  SpO2: 97% 97% 98% 98%  Weight:      Height:        Intake/Output Summary (Last 24 hours) at 09/27/2019 1125 Last data filed at 09/27/2019 0919 Gross per 24 hour  Intake 1820.5 ml  Output --  Net 1820.5 ml    06/07 1901 - 06/09 0700 In:  1700.5 [P.O.:120] Out: -   Filed Weights   09/26/19 0944  Weight: 56.7 kg    Physical Examination:    General: Appears in no acute distress  Cardiovascular: S1-S2, regular, no murmur auscultated  Respiratory: Clear to auscultation bilaterally, left flank tenderness  Abdomen: Abdomen is soft, nontender, no organomegaly  Extremities: No edema in the lower extremities  Neurologic: Cranial nerves II through XII grossly intact, no focal deficit noted    Data Reviewed:   Recent Results (from the past 240 hour(s))  SARS Coronavirus 2 by RT PCR (hospital order, performed in [facility] hospital lab) Nasopharyngeal Nasopharyngeal Swab     Status: None   Collection Time: 09/26/19  3:51 PM   Specimen: Nasopharyngeal Swab  Result Value Ref Range Status   SARS Coronavirus 2 NEGATIVE NEGATIVE Final    Comment: (NOTE) SARS-CoV-2 target nucleic acids are NOT DETECTED. The SARS-CoV-2 RNA is generally detectable in upper and lower respiratory specimens during the acute phase of infection. The lowest concentration of SARS-CoV-2 viral copies this assay can detect is 250 copies / mL. A negative result does not preclude SARS-CoV-2 infection and should not be used as the sole basis for treatment or other patient management decisions.  A negative result may occur with improper specimen collection / handling, submission of specimen other than nasopharyngeal swab, presence of viral mutation(s) within the areas targeted by this assay, and inadequate number of viral copies (<250 copies / mL). A negative result must be combined with clinical observations, patient history, and epidemiological information. Fact Sheet for Patients:   11/26/19 Fact Sheet for Healthcare Providers: BoilerBrush.com.cy This test is not yet approved or cleared  by the https://pope.com/ FDA and has been authorized for detection and/or diagnosis of SARS-CoV-2 by FDA  under an Emergency Use Authorization (EUA).  This EUA will remain in effect (meaning this test can be used) for the duration of the COVID-19 declaration under Section 564(b)(1) of the Act, 21 U.S.C. section 360bbb-3(b)(1), unless the authorization is terminated or revoked sooner. Performed at [facility], [address] [place], [place]   Culture, blood (routine x 2)     Status: None (Preliminary result)   Collection Time: 09/26/19 10:24 PM   Specimen: BLOOD  Result Value Ref Range Status   Specimen Description BLOOD LEFT ANTECUBITAL  Final   Special Requests   Final    BOTTLES DRAWN AEROBIC AND ANAEROBIC Blood Culture adequate volume   Culture   Final    NO GROWTH < 12 HOURS Performed at [facility], [address] [place], [place] [address]    Report Status PENDING  Incomplete  Culture, blood (routine x 2)     Status: None (Preliminary result)   Collection Time: 09/26/19 10:30 PM   Specimen: BLOOD LEFT WRIST  Result Value Ref Range Status   Specimen Description BLOOD LEFT WRIST  Final   Special Requests   Final    BOTTLES DRAWN AEROBIC AND ANAEROBIC Blood Culture adequate volume   Culture   Final    NO GROWTH < 12 HOURS Performed at [facility], [address], [place], [place] [address]    Report Status PENDING  Incomplete    No results for input(s): LIPASE, AMYLASE in the last 168 hours. No results for input(s): AMMONIA in the last 168 hours.  Cardiac Enzymes: Recent Labs  Lab 09/26/19 2038  CKTOTAL 60   BNP (last 3 results) Recent Labs    09/26/19 2039  BNP 81.8    ProBNP (last 3 results) No results for input(s): PROBNP in the last 8760 hours.  Studies:  CT Angio Chest PE W/Cm &/Or Wo Cm  Result Date: 09/26/2019 CLINICAL DATA:  Shortness of breath, concern for pulmonary infarction EXAM: CT ANGIOGRAPHY CHEST WITH CONTRAST TECHNIQUE: Multidetector CT imaging of the chest was performed using the standard protocol during  bolus administration of intravenous contrast. Multiplanar CT image reconstructions and MIPs were obtained to evaluate the vascular anatomy. CONTRAST:  OMNIPAQUE IOHEXOL 350 MG/ML SOLN. Power injection was initially attempted, but aborted due to concern for contrast extravasation; approximately 75 mL of contrast was injected on initial attempt. Due to previous administration of 100 mL contrast for CT of the abdomen pelvis, and additional volume of contrast was not administered, and the remainder of approximately 25 mL of contrast was injected in a second attempt. CT examination of the chest was acquired, however contrast bolus was nondiagnostic as discussed below. COMPARISON:  Same day CT abdomen pelvis FINDINGS: Cardiovascular: Contrast bolus is nondiagnostic for pulmonary embolism, with preferential opacification of the thoracic aorta. Normal heart size. No pericardial effusion. Mediastinum/Nodes: No enlarged mediastinal, hilar, or axillary lymph nodes. Thyroid gland, trachea, and esophagus demonstrate no significant findings. Lungs/Pleura: There is a dense, rounded subpleural opacity in the dependent left lung base with a surrounding ground-glass halo, central portion measuring approximately 4.0 cm (series 6, image 50) Upper Abdomen: No acute abnormality. Musculoskeletal: No chest wall abnormality. No acute or significant osseous findings. Review of the MIP images confirms the above findings. IMPRESSION: 1. Contrast bolus is nondiagnostic for pulmonary embolism, primarily due to technical failure described above. Given the overall clinical concern for pulmonary embolus, technical repeat examination with administration of a third dose of contrast was discussed with ordering provider Green, PA. 2. A dense, rounded subpleural opacity in the dependent left lung base is again seen, concerning for pulmonary infarction given appearance, although round pneumonia or aspiration could have this appearance. These  results were called by telephone at the time of interpretation on 09/26/2019 at 2:51 pm to provider PA GARRETT GREEN , who verbally acknowledged these results. Electronically Signed   By: Lauralyn Primes M.D.   On: 09/26/2019 14:52   CT ABDOMEN PELVIS W CONTRAST  Result Date: 09/26/2019 CLINICAL DATA:  Left flank pain since yesterday EXAM: CT ABDOMEN AND PELVIS WITH CONTRAST TECHNIQUE: Multidetector CT imaging of the abdomen and pelvis was performed using the standard protocol following bolus administration of intravenous contrast. CONTRAST:  OMNIPAQUE IOHEXOL 300 MG/ML SOLN, additional oral enteric contrast  COMPARISON:  None. FINDINGS: Lower chest: There is a partially imaged, rounded subpleural opacity of the dependent left lung base with a halo of ground-glass (series 4, image 1). Hepatobiliary: No solid liver abnormality is seen. Unchanged focal fatty deposition adjacent to the ligamentum venosum. No gallstones, gallbladder wall thickening, or biliary dilatation. Pancreas: Unremarkable. No pancreatic ductal dilatation or surrounding inflammatory changes. Spleen: Normal in size without significant abnormality. Adrenals/Urinary Tract: Adrenal glands are unremarkable. Kidneys are normal, without renal calculi, solid lesion, or hydronephrosis. Bladder is unremarkable. Stomach/Bowel: Stomach is within normal limits. Appendix appears normal. No evidence of bowel wall thickening, distention, or inflammatory changes. Vascular/Lymphatic: No significant vascular findings are present. No enlarged abdominal or pelvic lymph nodes. Reproductive: Status post hysterectomy. Other: No abdominal wall hernia or abnormality. No abdominopelvic ascites. Musculoskeletal: No acute or significant osseous findings. IMPRESSION: 1. No acute CT findings within the abdomen to explain left flank pain. No evidence of urinary tract calculus or hydronephrosis. 2. There is a partially imaged, rounded subpleural opacity of the dependent left  lung base with a surrounding halo of ground-glass, this appearance highly suspicious for pulmonary infarction and generally referable to reported symptoms. Nonspecific differential considerations include infection or aspiration. There is no embolus identified in the lower chest, however only the subsegmental arteries of the lung bases are included on this examination of the abdomen and pelvis. Consider CT pulmonary angiogram to further evaluate. 3.  Aortic Atherosclerosis (ICD10-I70.0). Electronically Signed   By: Lauralyn Primes M.D.   On: 09/26/2019 12:01       Moiz Ryant S Ryken Paschal   [facility] If 7PM-7AM, please contact night-coverage at [email or web address], Office  [phone]   09/27/2019, 11:25 AM  LOS: 1 day

## 2019-09-28 LAB — BASIC METABOLIC PANEL
Anion gap: 11 (ref 5–15)
BUN: 7 mg/dL (ref 6–20)
CO2: 23 mmol/L (ref 22–32)
Calcium: 8.4 mg/dL — ABNORMAL LOW (ref 8.9–10.3)
Chloride: 98 mmol/L (ref 98–111)
Creatinine, Ser: 0.79 mg/dL (ref 0.44–1.00)
GFR calc Af Amer: >60 mL/min (ref >60)
GFR calc non Af Amer: >60 mL/min (ref >60)
Glucose, Bld: 90 mg/dL (ref 70–99)
Potassium: 3.3 mmol/L — ABNORMAL LOW (ref 3.5–5.1)
Sodium: 132 mmol/L — ABNORMAL LOW (ref 135–145)

## 2019-09-28 LAB — CBC
HCT: 37.8 % (ref 36.0–46.0)
Hemoglobin: 13.7 g/dL (ref 12.0–15.0)
MCH: 41.5 pg — ABNORMAL HIGH (ref 26.0–34.0)
MCHC: 36.2 g/dL — ABNORMAL HIGH (ref 30.0–36.0)
MCV: 114.5 fL — ABNORMAL HIGH (ref 80.0–100.0)
Platelets: 170 10*3/uL (ref 150–400)
RBC: 3.3 MIL/uL — ABNORMAL LOW (ref 3.87–5.11)
RDW: 11.8 % (ref 11.5–15.5)
WBC: 7.6 10*3/uL (ref 4.0–10.5)
nRBC: 0 % (ref 0.0–0.2)

## 2019-09-28 LAB — URINE CULTURE: Culture: >=100000 — AB

## 2019-09-28 LAB — PROCALCITONIN: Procalcitonin: 0.63 ng/mL

## 2019-09-28 MED ORDER — LISINOPRIL 5 MG PO TABS
5.0000 mg | ORAL_TABLET | Freq: Every day | ORAL | Status: DC
  Administered 2019-09-28 – 2019-09-29 (×2): 5 mg via ORAL
  Filled 2019-09-28 (×2): qty 1

## 2019-09-28 MED ORDER — POTASSIUM CHLORIDE CRYS ER 20 MEQ PO TBCR
40.0000 meq | EXTENDED_RELEASE_TABLET | Freq: Once | ORAL | Status: AC
  Administered 2019-09-28: 40 meq via ORAL
  Filled 2019-09-28: qty 2

## 2019-09-28 MED ORDER — CARVEDILOL 3.125 MG PO TABS
3.1250 mg | ORAL_TABLET | Freq: Two times a day (BID) | ORAL | Status: DC
  Administered 2019-09-28 – 2019-09-29 (×2): 3.125 mg via ORAL
  Filled 2019-09-28 (×2): qty 1

## 2019-09-28 MED FILL — Fentanyl Citrate Preservative Free (PF) Inj 100 MCG/2ML: INTRAMUSCULAR | Qty: 2 | Status: AC

## 2019-09-28 NOTE — Progress Notes (Signed)
   09/28/19 2000  Assess: MEWS Score  Temp 99.8 F (37.7 C)  BP (!) 117/91  Pulse Rate 89  Resp 18  Level of Consciousness Alert  SpO2 94 %  Assess: MEWS Score  MEWS Temp 0  MEWS Systolic 0  MEWS Pulse 0  MEWS RR 0  MEWS LOC 0  MEWS Score 0  MEWS Score Color Green  Assess: if the MEWS score is Yellow or Red  Were vital signs taken at a resting state? Yes  Focused Assessment Documented focused assessment  Early Detection of Sepsis Score *See Row Information* Low  MEWS guidelines implemented *See Row Information* Yes  Treat  MEWS Interventions Administered scheduled meds/treatments  Take Vital Signs  Increase Vital Sign Frequency  Yellow: Q 2hr X 2 then Q 4hr X 2, if remains yellow, continue Q 4hrs  Document  Patient Outcome Stabilized after interventions  Progress note created (see row info) Yes

## 2019-09-28 NOTE — Progress Notes (Addendum)
[facility]  PROGRESS NOTE  Miranda Bernard NGE:[id number] DOB: [birth year] DOA: 09/26/2019 PCP: Lavell Islam, MD   Brief HPI:   49 year old female with medical history of hypertension, hyperlipidemia, GERD, bipolar disorder, psoriasis on Humira, previous splenic infarct presents with left flank pain. CT abdomen pelvis and CTA chest were done. CT abdomen pelvis was unremarkable. CT chest showed possibility of round pneumonia versus aspiration pneumonia versus pulmonary infarction. CT chest was inconclusive but pulmonary embolism and another repeat CT chest was recommended in the next 24 hours. Patient was empirically started on IV heparin.    Subjective   Patient seen and examined, CT chest was negative for pulmonary embolism.  It confirms pneumonia in left lower lung base.   Assessment/Plan:     1. Community-acquired pneumonia versus aspiration pneumonia-CTA chest confirms round pneumonia in left lower lobe.  CT chest without contrast recommended in 3 to 4 weeks to check for resolution of pneumonia.  IV heparin has been discontinued.  Venous duplex of lower extremities is negative for DVT.  2. SIRS-patient developed fever, tachycardia this morning.  There is a concern for possible aspiration pneumonia on CT chest.  Patient was started on IV Unasyn, procalcitonin elevated to 0.63.  CRP 15.  Continue with IV Unasyn.  Follow blood culture results.   3. Hypertension-blood pressure is mildly elevated, continue lisinopril.  Will also add low-dose Coreg 3.125 mg p.o. twice daily  4. Bipolar disorder-continue Lamictal 5. Hypokalemia-potassium was 3.3, replace potassium and follow BMP in a.m. 6. Hyperlipidemia-patient is not on statin at home, follow lipid panel. 7. History of CHF with EF 45%-echocardiogram obtained this morning shows EF 40 to 45%.  Left ventricle demonstrates global hypokinesis.  Left ventricular diastolic parameters are indeterminate.  She is not in acute exacerbation at  this time.  I will add Coreg 3.125 mg p.o. twice daily, she is already on lisinopril 5 mg daily as above. 8. Psoriatic arthritis-patient takes Humira as outpatient.    SpO2: 99 %   COVID-19 Labs  Recent Labs    09/26/19 2038  CRP 15.7*    Lab Results  Component Value Date   SARSCOV2NAA NEGATIVE 09/26/2019     CBG: Recent Labs  Lab 09/26/19 2057 09/27/19 0747  GLUCAP 120* 189*    CBC: Recent Labs  Lab 09/26/19 1021 09/27/19 0402 09/28/19 0412  WBC 11.6* 10.9* 7.6  NEUTROABS 9.2*  --   --   HGB 15.6* 14.7 13.7  HCT 43.9 41.7 37.8  MCV 116.1* 118.1* 114.5*  PLT 227 178 170    Basic Metabolic Panel: Recent Labs  Lab 09/26/19 1021 09/27/19 0402 09/28/19 0412  NA 134* 135 132*  K 3.7 4.2 3.3*  CL 100 101 98  CO2 GLUCOSE 120* 110* 90  BUN CREATININE 0.71 0.74 0.79  CALCIUM 9.1 8.7* 8.4*     Liver Function Tests: Recent Labs  Lab 09/26/19 1021 09/27/19 0402  AST 19 21  ALT 14 13  ALKPHOS 71 61  BILITOT 2.0* 2.3*  PROT 7.5 6.4*  ALBUMIN 4.3 3.4*        DVT prophylaxis: IV heparin  Code Status: Full code  Family Communication: No family at bedside    Status is: Inpatient  Dispo: The patient is from: Home              Anticipated d/c is to: Home              Anticipated  d/c date is: 09/30/2019              Patient currently not medically stable for discharge  Barrier to discharge-ongoing treatment for pneumonia        Scheduled medications:  . atorvastatin  40 mg Oral Daily  . enoxaparin (LOVENOX) injection  40 mg Subcutaneous Q24H  . FLUoxetine  20 mg Oral Daily  . fluticasone  2 spray Each Nare Daily  . lamoTRIgine  75 mg Oral Daily  . pantoprazole  40 mg Oral Daily  . senna-docusate  1 tablet Oral Daily    Consultants:  None  Procedures:  None  Antibiotics:   Anti-infectives (From admission, onward)   Start     Dose/Rate Route Frequency Ordered Stop   09/27/19 2200  Ampicillin-Sulbactam  (UNASYN) 3 g in sodium chloride 0.9 % 100 mL IVPB  Status:  Discontinued        3 g 200 mL/hr over 30 Minutes Intravenous Every 6 hours 09/27/19 1518 09/27/19 1833   09/27/19 2100  Ampicillin-Sulbactam (UNASYN) 3 g in sodium chloride 0.9 % 100 mL IVPB     Discontinue     3 g 200 mL/hr over 30 Minutes Intravenous Every 6 hours 09/27/19 1833     09/27/19 1230  Ampicillin-Sulbactam (UNASYN) 3 g in sodium chloride 0.9 % 100 mL IVPB  Status:  Discontinued        3 g 200 mL/hr over 30 Minutes Intravenous Every 6 hours 09/27/19 1154 09/27/19 1518       Objective   Vitals:   09/28/19 0600 09/28/19 0700 09/28/19 0817 09/28/19 1008  BP: (!) 152/91 (!) 154/91 (!) 153/81 (!) 147/82  Pulse: (!) 101 (!) 103 99 98  Resp: Temp: (!) 102 F (38.9 C) 100.2 F (37.9 C) 99.6 F (37.6 C) 99.2 F (37.3 C)  TempSrc: Oral  Oral Oral  SpO2: 98% 97% 95% 99%  Weight:      Height:        Intake/Output Summary (Last 24 hours) at 09/28/2019 1039 Last data filed at 09/28/2019 1610 Gross per 24 hour  Intake 722.29 ml  Output --  Net 722.29 ml    06/08 1901 - 06/10 0700 In: 842.3 [P.O.:480; I.V.:262.3] Out: -   Filed Weights   09/26/19 0944  Weight: 56.7 kg    Physical Examination:   General-appears in no acute distress  Heart-S1-S2, regular, no murmur auscultated  Lungs-clear to auscultation bilaterally, no wheezing or crackles auscultated, dullness to percussion left lower lobe  Abdomen-soft, nontender, no organomegaly, left flank tenderness on percussion  Extremities-no edema in the lower extremities  Neuro-alert, oriented x3, no focal deficit noted    Data Reviewed:   Recent Results (from the past 240 hour(s))  Culture, Urine     Status: Abnormal   Collection Time: 09/26/19 11:31 AM   Specimen: Urine, Clean Catch  Result Value Ref Range Status   Specimen Description   Final    URINE, CLEAN CATCH Performed at [facility], [address] [place], [place] [address]    Special Requests   Final    NONE Performed at [facility], [address] [place], [place] [address]    Culture >=100,000 COLONIES/mL ESCHERICHIA COLI (A)  Final   Report Status 09/28/2019 FINAL  Final   Organism ID, Bacteria ESCHERICHIA COLI (A)  Final      Susceptibility   Escherichia coli - MIC*  AMPICILLIN 4 SENSITIVE Sensitive     CEFAZOLIN <=4 SENSITIVE Sensitive     CEFTRIAXONE <=1 SENSITIVE Sensitive     CIPROFLOXACIN <=0.25 SENSITIVE Sensitive     GENTAMICIN <=1 SENSITIVE Sensitive     IMIPENEM <=0.25 SENSITIVE Sensitive     NITROFURANTOIN <=16 SENSITIVE Sensitive     TRIMETH/SULFA <=20 SENSITIVE Sensitive     AMPICILLIN/SULBACTAM <=2 SENSITIVE Sensitive     PIP/TAZO <=4 SENSITIVE Sensitive     * >=100,000 COLONIES/mL ESCHERICHIA COLI  SARS Coronavirus 2 by RT PCR (hospital order, performed [facility]) Nasopharyngeal Nasopharyngeal Swab     Status: None   Collection Time: 09/26/19  3:51 PM   Specimen: Nasopharyngeal Swab  Result Value Ref Range Status   SARS Coronavirus 2 NEGATIVE NEGATIVE Final    Comment: (NOTE) SARS-CoV-2 target nucleic acids are NOT DETECTED. The SARS-CoV-2 RNA is generally detectable in upper and lower respiratory specimens during the acute phase of infection. The lowest concentration of SARS-CoV-2 viral copies this assay can detect is 250 copies / mL. A negative result does not preclude SARS-CoV-2 infection and should not be used as the sole basis for treatment or other patient management decisions.  A negative result may occur with improper specimen collection / handling, submission of specimen other than nasopharyngeal swab, presence of viral mutation(s) within the areas targeted by this assay, and inadequate number of viral copies (<250 copies / mL). A negative result must be combined with clinical observations, patient history, and epidemiological information. Fact Sheet for Patients:    BoilerBrush.com.cyhttps://www.fda.gov/media/136312/download Fact Sheet for Healthcare Providers: https://pope.com/https://www.fda.gov/media/136313/download This test is not yet approved or cleared  by the [place] FDA and has been authorized for detection and/or diagnosis of SARS-CoV-2 by FDA under an Emergency Use Authorization (EUA).  This EUA will remain in effect (meaning this test can be used) for the duration of the COVID-19 declaration under Section 564(b)(1) of the Act, 21 U.S.C. section 360bbb-3(b)(1), unless the authorization is terminated or revoked sooner. Performed at [facility], [address] [place], [place] [address]   Culture, blood (routine x 2)     Status: None (Preliminary result)   Collection Time: 09/26/19 10:24 PM   Specimen: BLOOD  Result Value Ref Range Status   Specimen Description BLOOD LEFT ANTECUBITAL  Final   Special Requests   Final    BOTTLES DRAWN AEROBIC AND ANAEROBIC Blood Culture adequate volume   Culture   Final    NO GROWTH 2 DAYS Performed at [facility], [address] [place], [place] [address]    Report Status PENDING  Incomplete  Culture, blood (routine x 2)     Status: None (Preliminary result)   Collection Time: 09/26/19 10:30 PM   Specimen: BLOOD LEFT WRIST  Result Value Ref Range Status   Specimen Description BLOOD LEFT WRIST  Final   Special Requests   Final    BOTTLES DRAWN AEROBIC AND ANAEROBIC Blood Culture adequate volume   Culture   Final    NO GROWTH 2 DAYS Performed at [facility], [address] [place], [place] [address]    Report Status PENDING  Incomplete    No results for input(s): LIPASE, AMYLASE in the last 168 hours. No results for input(s): AMMONIA in the last 168 hours.  Cardiac Enzymes: Recent Labs  Lab 09/26/19 2038  CKTOTAL 60   BNP (last 3 results) Recent Labs    09/26/19 2039  BNP 81.8  ProBNP (last 3 results) No results for input(s): PROBNP in the last 8760 hours.  Studies:  CT ANGIO  CHEST PE W OR WO CONTRAST  Result Date: 09/27/2019 CLINICAL DATA:  Left-sided chest pain and known left lower lobe lesion, history of recent inconclusive CTA of the chest EXAM: CT ANGIOGRAPHY CHEST WITH CONTRAST TECHNIQUE: Multidetector CT imaging of the chest was performed using the standard protocol during bolus administration of intravenous contrast. Multiplanar CT image reconstructions and MIPs were obtained to evaluate the vascular anatomy. CONTRAST:  78mL OMNIPAQUE IOHEXOL 350 MG/ML SOLN COMPARISON:  CT from the previous day. FINDINGS: Cardiovascular: Thoracic aorta and its branches are within normal limits. No coronary calcifications are seen. No cardiac enlargement is noted. The pulmonary artery shows a normal branching pattern bilaterally. No definitive filling defects are identified to suggest pulmonary embolism. Mediastinum/Nodes: Thoracic inlet is within normal limits. No mediastinal adenopathy is noted. Better visualized on today's exam are multiple small left hilar nodes the largest of which measure approximately 9 mm. These are felt to be reactive in nature given the findings in the left lower lobe. The esophagus as visualized is within normal limits. Lungs/Pleura: Lungs are well aerated bilaterally. In the left lower lobe there is again noted a pleural based rounded density which measures approximately 6 cm in greatest dimension. Vessels are noted to course through this area of abnormality. Central decreased attenuation remains. This is slightly more prominent than that seen on the prior exam. A surrounding ground-glass opacity is noted similar to that seen on the prior exam. No other focal infiltrate is noted. Upper Abdomen: Visualized upper abdomen is unremarkable. Musculoskeletal: No acute bony abnormality is noted. Review of the MIP images confirms the above findings. IMPRESSION: Changes in the left lower lobe most consistent with a round pneumonia given the appearance with surrounding  inflammatory change and reactive lymphadenopathy. Short-term follow-up noncontrast CT is recommended in 3-4 weeks following appropriate antibiotic therapy. No evidence of pulmonary emboli. Electronically Signed   By: Alcide Clever M.D.   On: 09/27/2019 15:52   CT Angio Chest PE W/Cm &/Or Wo Cm  Result Date: 09/26/2019 CLINICAL DATA:  Shortness of breath, concern for pulmonary infarction EXAM: CT ANGIOGRAPHY CHEST WITH CONTRAST TECHNIQUE: Multidetector CT imaging of the chest was performed using the standard protocol during bolus administration of intravenous contrast. Multiplanar CT image reconstructions and MIPs were obtained to evaluate the vascular anatomy. CONTRAST:  OMNIPAQUE IOHEXOL 350 MG/ML SOLN. Power injection was initially attempted, but aborted due to concern for contrast extravasation; approximately 75 mL of contrast was injected on initial attempt. Due to previous administration of 100 mL contrast for CT of the abdomen pelvis, and additional volume of contrast was not administered, and the remainder of approximately 25 mL of contrast was injected in a second attempt. CT examination of the chest was acquired, however contrast bolus was nondiagnostic as discussed below. COMPARISON:  Same day CT abdomen pelvis FINDINGS: Cardiovascular: Contrast bolus is nondiagnostic for pulmonary embolism, with preferential opacification of the thoracic aorta. Normal heart size. No pericardial effusion. Mediastinum/Nodes: No enlarged mediastinal, hilar, or axillary lymph nodes. Thyroid gland, trachea, and esophagus demonstrate no significant findings. Lungs/Pleura: There is a dense, rounded subpleural opacity in the dependent left lung base with a surrounding ground-glass halo, central portion measuring approximately 4.0 cm (series 6, image 50) Upper Abdomen: No acute abnormality. Musculoskeletal: No chest wall abnormality. No acute or significant osseous findings. Review of the MIP images confirms the above  findings. IMPRESSION: 1.  Contrast bolus is nondiagnostic for pulmonary embolism, primarily due to technical failure described above. Given the overall clinical concern for pulmonary embolus, technical repeat examination with administration of a third dose of contrast was discussed with ordering provider Green, PA. 2. A dense, rounded subpleural opacity in the dependent left lung base is again seen, concerning for pulmonary infarction given appearance, although round pneumonia or aspiration could have this appearance. These results were called by telephone at the time of interpretation on 09/26/2019 at 2:51 pm to provider PA GARRETT GREEN , who verbally acknowledged these results. Electronically Signed   By: Lauralyn Primes M.D.   On: 09/26/2019 14:52   CT ABDOMEN PELVIS W CONTRAST  Result Date: 09/26/2019 CLINICAL DATA:  Left flank pain since yesterday EXAM: CT ABDOMEN AND PELVIS WITH CONTRAST TECHNIQUE: Multidetector CT imaging of the abdomen and pelvis was performed using the standard protocol following bolus administration of intravenous contrast. CONTRAST:  OMNIPAQUE IOHEXOL 300 MG/ML SOLN, additional oral enteric contrast COMPARISON:  None. FINDINGS: Lower chest: There is a partially imaged, rounded subpleural opacity of the dependent left lung base with a halo of ground-glass (series 4, image 1). Hepatobiliary: No solid liver abnormality is seen. Unchanged focal fatty deposition adjacent to the ligamentum venosum. No gallstones, gallbladder wall thickening, or biliary dilatation. Pancreas: Unremarkable. No pancreatic ductal dilatation or surrounding inflammatory changes. Spleen: Normal in size without significant abnormality. Adrenals/Urinary Tract: Adrenal glands are unremarkable. Kidneys are normal, without renal calculi, solid lesion, or hydronephrosis. Bladder is unremarkable. Stomach/Bowel: Stomach is within normal limits. Appendix appears normal. No evidence of bowel wall thickening, distention, or  inflammatory changes. Vascular/Lymphatic: No significant vascular findings are present. No enlarged abdominal or pelvic lymph nodes. Reproductive: Status post hysterectomy. Other: No abdominal wall hernia or abnormality. No abdominopelvic ascites. Musculoskeletal: No acute or significant osseous findings. IMPRESSION: 1. No acute CT findings within the abdomen to explain left flank pain. No evidence of urinary tract calculus or hydronephrosis. 2. There is a partially imaged, rounded subpleural opacity of the dependent left lung base with a surrounding halo of ground-glass, this appearance highly suspicious for pulmonary infarction and generally referable to reported symptoms. Nonspecific differential considerations include infection or aspiration. There is no embolus identified in the lower chest, however only the subsegmental arteries of the lung bases are included on this examination of the abdomen and pelvis. Consider CT pulmonary angiogram to further evaluate. 3.  Aortic Atherosclerosis (ICD10-I70.0). Electronically Signed   By: Lauralyn Primes M.D.   On: 09/26/2019 12:01   ECHOCARDIOGRAM COMPLETE  Result Date: 09/27/2019    ECHOCARDIOGRAM REPORT   Patient Name:   Miranda Bernard Date of Exam: 09/27/2019 Medical Rec #:  [id number]         Height:       63.0 in Accession #:    [id number]        Weight:       125.0 lb Date of Birth:  1970/09/22         BSA:          1.584 m Patient Age:    49 years          BP:           145/81 mmHg Patient Gender: F                 HR:           95 bpm. Exam Location:  Inpatient Procedure: 2D Echo, Cardiac Doppler and Color Doppler Indications:  Pulmonary Embolus 415.19 / I26.99  History:        Patient has prior history of Echocardiogram examinations, most                 recent 04/30/2014. Risk Factors:Hypertension, Dyslipidemia and                 Current Smoker. PE.  Sonographer:    Renella Cunas RDCS Referring Phys: [id number] SARA-MAIZ A THOMAS IMPRESSIONS  1. Left ventricular  ejection fraction, by estimation, is 40 to 45%. The left ventricle has mildly decreased function. The left ventricle demonstrates global hypokinesis. Left ventricular diastolic parameters are indeterminate.  2. Right ventricular systolic function is normal. The right ventricular size is normal. Tricuspid regurgitation signal is inadequate for assessing PA pressure.  3. The mitral valve is normal in structure. Trivial mitral valve regurgitation. No evidence of mitral stenosis.  4. The aortic valve is tricuspid. Aortic valve regurgitation is not visualized. No aortic stenosis is present.  5. The inferior vena cava is normal in size with greater than 50% respiratory variability, suggesting right atrial pressure of 3 mmHg. FINDINGS  Left Ventricle: Left ventricular ejection fraction, by estimation, is 40 to 45%. The left ventricle has mildly decreased function. The left ventricle demonstrates global hypokinesis. The left ventricular internal cavity size was normal in size. There is  no left ventricular hypertrophy. Left ventricular diastolic parameters are indeterminate. Right Ventricle: The right ventricular size is normal. No increase in right ventricular wall thickness. Right ventricular systolic function is normal. Tricuspid regurgitation signal is inadequate for assessing PA pressure. Left Atrium: Left atrial size was normal in size. Right Atrium: Right atrial size was normal in size. Pericardium: Trivial pericardial effusion is present. Mitral Valve: The mitral valve is normal in structure. Trivial mitral valve regurgitation. No evidence of mitral valve stenosis. Tricuspid Valve: The tricuspid valve is normal in structure. Tricuspid valve regurgitation is trivial. No evidence of tricuspid stenosis. Aortic Valve: The aortic valve is tricuspid. Aortic valve regurgitation is not visualized. No aortic stenosis is present. Pulmonic Valve: The pulmonic valve was grossly normal. Pulmonic valve regurgitation is not  visualized. No evidence of pulmonic stenosis. Aorta: The aortic root and ascending aorta are structurally normal, with no evidence of dilitation. Venous: The inferior vena cava is normal in size with greater than 50% respiratory variability, suggesting right atrial pressure of 3 mmHg. IAS/Shunts: No atrial level shunt detected by color flow Doppler.  LEFT VENTRICLE PLAX 2D LVIDd:         4.50 cm      Diastology LVIDs:         4.10 cm      LV e' lateral:   5.66 cm/s LV PW:         0.90 cm      LV E/e' lateral: 12.8 LV IVS:        0.70 cm      LV e' medial:    5.22 cm/s LVOT diam:     1.90 cm      LV E/e' medial:  13.8 LV SV:         39 LV SV Index:   25 LVOT Area:     2.84 cm  LV Volumes (MOD) LV vol d, MOD A2C: 109.0 ml LV vol d, MOD A4C: 94.6 ml LV vol s, MOD A2C: 60.6 ml LV vol s, MOD A4C: 54.1 ml LV SV MOD A2C:     48.4 ml LV SV MOD A4C:  94.6 ml LV SV MOD BP:      44.3 ml RIGHT VENTRICLE RV S prime:     12.00 cm/s TAPSE (M-mode): 1.8 cm LEFT ATRIUM             Index       RIGHT ATRIUM          Index LA diam:        2.50 cm 1.58 cm/m  RA Area:     9.22 cm LA Vol (A2C):   21.4 ml 13.51 ml/m RA Volume:   18.40 ml 11.62 ml/m LA Vol (A4C):   16.5 ml 10.42 ml/m LA Biplane Vol: 19.3 ml 12.19 ml/m  AORTIC VALVE LVOT Vmax:   91.40 cm/s LVOT Vmean:  59.600 cm/s LVOT VTI:    0.137 m  AORTA Ao Root diam: 2.90 cm MITRAL VALVE MV Area (PHT): 3.34 cm     SHUNTS MV Decel Time: 227 msec     Systemic VTI:  0.14 m MV E velocity: 72.20 cm/s   Systemic Diam: 1.90 cm MV A velocity: 108.00 cm/s MV E/A ratio:  0.67 Jodelle Red MD Electronically signed by Jodelle Red MD Signature Date/Time: 09/27/2019/2:28:21 PM    Final    VAS [place] LOWER EXTREMITY VENOUS (DVT)  Result Date: 09/27/2019  Lower Venous DVTStudy Indications: Pain.  Risk Factors: None identified. Comparison Study: No prior studies. Performing Technologist: Chanda Busing RVT  Examination Guidelines: A complete evaluation includes B-mode  imaging, spectral Doppler, color Doppler, and power Doppler as needed of all accessible portions of each vessel. Bilateral testing is considered an integral part of a complete examination. Limited examinations for reoccurring indications may be performed as noted. The reflux portion of the exam is performed with the patient in reverse Trendelenburg.  +-----+---------------+---------+-----------+----------+--------------+ RIGHTCompressibilityPhasicitySpontaneityPropertiesThrombus Aging +-----+---------------+---------+-----------+----------+--------------+ CFV  Full           Yes      Yes                                 +-----+---------------+---------+-----------+----------+--------------+   +---------+---------------+---------+-----------+----------+--------------+ LEFT     CompressibilityPhasicitySpontaneityPropertiesThrombus Aging +---------+---------------+---------+-----------+----------+--------------+ CFV      Full           Yes      Yes                                 +---------+---------------+---------+-----------+----------+--------------+ SFJ      Full                                                        +---------+---------------+---------+-----------+----------+--------------+ FV Prox  Full                                                        +---------+---------------+---------+-----------+----------+--------------+ FV Mid   Full                                                        +---------+---------------+---------+-----------+----------+--------------+  FV DistalFull                                                        +---------+---------------+---------+-----------+----------+--------------+ PFV      Full                                                        +---------+---------------+---------+-----------+----------+--------------+ POP      Full           Yes      Yes                                  +---------+---------------+---------+-----------+----------+--------------+ PTV      Full                                                        +---------+---------------+---------+-----------+----------+--------------+ PERO     Full                                                        +---------+---------------+---------+-----------+----------+--------------+     Summary: RIGHT: - No evidence of common femoral vein obstruction.  LEFT: - There is no evidence of deep vein thrombosis in the lower extremity.  - No cystic structure found in the popliteal fossa.  *See table(s) above for measurements and observations. Electronically signed by Monica Martinez MD on 09/27/2019 at 3:58:33 PM.    Final        Oswald Hillock   [facility] If 7PM-7AM, please contact night-coverage at [email or web address], Office  [phone]   09/28/2019, 10:39 AM  LOS: 2 days

## 2019-09-28 NOTE — Progress Notes (Addendum)
Pt newest vitals placed her back in yellow mews. Pt still on yellow mews guidelines, PRN given. Will continue to monitor vitals q4.

## 2019-09-28 NOTE — Plan of Care (Signed)

## 2019-09-28 NOTE — Progress Notes (Addendum)
   09/28/19 0600  Assess: MEWS Score  Temp (!) 102 F (38.9 C)  BP (!) 152/91  ECG Heart Rate (!) 101  Resp 18  Level of Consciousness Alert  SpO2 98 %  O2 Device Room Air  Patient Activity (if Appropriate) In bed  Assess: MEWS Score  MEWS Temp 2  MEWS Systolic 0  MEWS Pulse 1  MEWS RR 0  MEWS LOC 0  MEWS Score 3  MEWS Score Color Yellow  Assess: if the MEWS score is Yellow or Red  Were vital signs taken at a resting state? Yes  Focused Assessment Documented focused assessment  Early Detection of Sepsis Score *See Row Information* Low  MEWS guidelines implemented *See Row Information* Yes  Treat  MEWS Interventions Administered scheduled meds/treatments  Take Vital Signs  Increase Vital Sign Frequency  Yellow: Q 2hr X 2 then Q 4hr X 2, if remains yellow, continue Q 4hrs  Escalate  MEWS: Escalate Yellow: discuss with charge nurse/RN and consider discussing with provider and RRT  Notify: Charge Nurse/RN  Name of Charge Nurse/RN Notified Lauren  Date Charge Nurse/RN Notified 09/28/19  Time Charge Nurse/RN Notified 0630  Notify: Provider  Provider Name/Title M. Katherina Right  Date Provider Notified 09/28/19  Time Provider Notified [phone]  Notification Type Page  Notification Reason Other (Comment) (Change in V/s)  Response No new orders (End of shift change. handoff to nurse)  Document  Patient Outcome Other (Comment) (Provided meds, will assess in , Pt stable )  Progress note created (see row info) Yes     PRN Tylenol 650mg  PO given to pt. Pt not in any distress. No complaints of pain, dizziness, SOB or DOB. Will recheck pt and report to oncoming nurse

## 2019-09-29 DIAGNOSIS — J189 Pneumonia, unspecified organism: Secondary | ICD-10-CM

## 2019-09-29 LAB — CBC
HCT: 35 % — ABNORMAL LOW (ref 36.0–46.0)
Hemoglobin: 12.4 g/dL (ref 12.0–15.0)
MCH: 40.8 pg — ABNORMAL HIGH (ref 26.0–34.0)
MCHC: 35.4 g/dL (ref 30.0–36.0)
MCV: 115.1 fL — ABNORMAL HIGH (ref 80.0–100.0)
Platelets: 165 10*3/uL (ref 150–400)
RBC: 3.04 MIL/uL — ABNORMAL LOW (ref 3.87–5.11)
RDW: 11.7 % (ref 11.5–15.5)
WBC: 6 10*3/uL (ref 4.0–10.5)
nRBC: 0 % (ref 0.0–0.2)

## 2019-09-29 LAB — BASIC METABOLIC PANEL
Anion gap: 7 (ref 5–15)
BUN: 6 mg/dL (ref 6–20)
CO2: 24 mmol/L (ref 22–32)
Calcium: 8.3 mg/dL — ABNORMAL LOW (ref 8.9–10.3)
Chloride: 97 mmol/L — ABNORMAL LOW (ref 98–111)
Creatinine, Ser: 0.75 mg/dL (ref 0.44–1.00)
GFR calc Af Amer: >60 mL/min (ref >60)
GFR calc non Af Amer: >60 mL/min (ref >60)
Glucose, Bld: 92 mg/dL (ref 70–99)
Potassium: 3.7 mmol/L (ref 3.5–5.1)
Sodium: 128 mmol/L — ABNORMAL LOW (ref 135–145)

## 2019-09-29 LAB — PROCALCITONIN: Procalcitonin: 0.82 ng/mL

## 2019-09-29 MED ORDER — AMLODIPINE BESYLATE 10 MG PO TABS: 10.0000 mg | ORAL_TABLET | Freq: Every day | ORAL | 2 refills | Status: AC

## 2019-09-29 MED ORDER — AMOXICILLIN-POT CLAVULANATE 875-125 MG PO TABS: 1.0000 | ORAL_TABLET | Freq: Two times a day (BID) | ORAL | 0 refills | Status: AC

## 2019-09-29 MED ORDER — OXYCODONE-ACETAMINOPHEN 7.5-325 MG PO TABS: 1.0000 | ORAL_TABLET | Freq: Four times a day (QID) | ORAL | 0 refills | Status: AC | PRN

## 2019-09-29 MED ORDER — FLUOXETINE HCL 20 MG PO TABS: 20.0000 mg | ORAL_TABLET | Freq: Every day | ORAL | 3 refills | Status: AC

## 2019-09-29 MED ORDER — CARVEDILOL 3.125 MG PO TABS: 3.1250 mg | ORAL_TABLET | Freq: Two times a day (BID) | ORAL | 2 refills | Status: AC

## 2019-09-29 MED ORDER — LAMOTRIGINE 25 MG PO TABS: 75.0000 mg | ORAL_TABLET | Freq: Every day | ORAL | 1 refills | Status: AC

## 2019-09-29 NOTE — Progress Notes (Signed)
Pt reviewed AVS with RN, all questions answered. Pt dressed and belongings gathered, pt called boyfriend and taken down to be d/ced home.

## 2019-09-29 NOTE — Discharge Summary (Signed)
Physician Discharge Summary  Miranda Bernard NWG:[id number] DOB: Aug 06, 1970 DOA: 09/26/2019  PCP: Lavell Islam, MD  Admit date: 09/26/2019 Discharge date: 09/29/2019  Time spent: 35 minutes  Recommendations for Outpatient Follow-up:  1. Follow-up PCP in 2 weeks 2. Will need repeat CT chest without contrast in 3 to 4 weeks to check for resolution of pneumonia.  Discharge Diagnoses:  Active Problems:   Flank pain Community-acquired pneumonia UTI   Discharge Condition: Stable  Diet recommendation: Heart healthy diet  Filed Weights   09/26/19 0944  Weight: 56.7 kg    History of present illness:  49 year old female with medical history of hypertension, hyperlipidemia, GERD, bipolar disorder, psoriasis on Humira, previous splenic infarct presents with left flank pain. CT abdomen pelvis and CTA chest were done. CT abdomen pelvis was unremarkable. CT chest showed possibility of round pneumonia versus aspiration pneumonia versus pulmonary infarction. CT chest was inconclusive but pulmonary embolism and another repeat CT chest was recommended in the next 24 hours. Patient was empirically started on IV heparin.   Hospital Course:   1. Community-acquired pneumonia versus aspiration pneumonia-CTA chest confirms round pneumonia in left lower lobe.  Did not show pulmonary embolism. CT chest without contrast recommended in 3 to 4 weeks to check for resolution of pneumonia.  IV heparin has been discontinued.  Venous duplex of lower extremities is negative for DVT.  Patient was started on IV Unasyn.  Will discharge on Augmentin 1 tablet p.o. twice daily for 10 days.  2. SIRS-patient developed fever, tachycardia .    Procalcitonin was elevated.  CT chest confirmed community-acquired pneumonia as above.  She was started on IV Unasyn.  At this time fever, tachycardia has resolved.  We will discharge her on Augmentin for 10 more days as above.  3. UTI-urine culture grew E. coli sensitive to  ampicillin/sulbactam.  Patient started on Augmentin as above.  4. Hypertension-blood pressure is mildly elevated, continue lisinopril.  Will also add low-dose Coreg 3.125 mg p.o. twice daily.  Continue amlodipine 10 mg p.o. daily.  5. Bipolar disorder-continue Lamictal, Prozac.  Follow-up psychiatry as outpatient.  Patient follows up psych at [facility].  6. Hypokalemia-replete  7. Hyperlipidemia- continue Crestor  8. History of CHF with EF 45%-echocardiogram obtained this morning shows EF 40 to 45%.  Left ventricle demonstrates global hypokinesis.  Left ventricular diastolic parameters are indeterminate.  She is not in acute exacerbation at this time.  Patient started on Coreg 3.125 mg p.o. twice daily.  Continue lisinopril 5 mg daily.  9. Psoriatic arthritis-patient takes Humira as outpatient.   Procedures:    Consultations:    Discharge Exam: Vitals:   09/29/19 0308 09/29/19 0749  BP: 126/74 (!) 157/81  Pulse: 85 97  Resp: 19 14  Temp: 99.6 F (37.6 C) (!) 100.7 F (38.2 C)  SpO2: 99% 99%    General: Appears in no acute distress Cardiovascular: S1-S2, regular Respiratory: Clear to auscultation bilaterally  Discharge Instructions   Discharge Instructions    Diet - low sodium heart healthy   Complete by: As directed    Increase activity slowly   Complete by: As directed      Allergies as of 09/29/2019      Reactions   Frovatriptan Other (See Comments)   Joint pain   Imitrex [sumatriptan] Other (See Comments)   "Felt like I was having spasms"   Wellbutrin [bupropion] Nausea And Vomiting      Medication List    STOP taking these medications  HYDROcodone-acetaminophen 5-325 MG tablet Commonly known as: NORCO/VICODIN   ibuprofen 600 MG tablet Commonly known as: ADVIL   naproxen 500 MG tablet Commonly known as: NAPROSYN     TAKE these medications   Adalimumab 40 MG/0.4ML Pnkt Inject 40 mg into the skin every 7 (seven) days.   albuterol (2.5 MG/3ML)  0.083% nebulizer solution Commonly known as: PROVENTIL Take 2.5 mg by nebulization every 6 (six) hours as needed for wheezing or shortness of breath.   amLODipine 10 MG tablet Commonly known as: NORVASC Take 1 tablet (10 mg total) by mouth daily.   amoxicillin-clavulanate 875-125 MG tablet Commonly known as: Augmentin Take 1 tablet by mouth 2 (two) times daily for 10 days.   carvedilol 3.125 MG tablet Commonly known as: COREG Take 1 tablet (3.125 mg total) by mouth 2 (two) times daily with a meal.   FLUoxetine 20 MG tablet Commonly known as: PROZAC Take 1 tablet (20 mg total) by mouth daily.   lamoTRIgine 25 MG tablet Commonly known as: LAMICTAL Take 3 tablets (75 mg total) by mouth daily.   lisinopril 5 MG tablet Commonly known as: ZESTRIL Take 5 mg by mouth daily.   omeprazole 40 MG capsule Commonly known as: PRILOSEC Take 1 capsule by mouth in the morning and at bedtime.   oxyCODONE-acetaminophen 7.5-325 MG tablet Commonly known as: PERCOCET Take 1 tablet by mouth every 6 (six) hours as needed for moderate pain.   rosuvastatin 10 MG tablet Commonly known as: CRESTOR Take 10 mg by mouth daily.   senna-docusate 8.6-50 MG tablet Commonly known as: Senokot-S Take 1 tablet by mouth daily.      Allergies  Allergen Reactions  . Frovatriptan Other (See Comments)    Joint pain  . Imitrex [Sumatriptan] Other (See Comments)    "Felt like I was having spasms"  . Wellbutrin [Bupropion] Nausea And Vomiting      The results of significant diagnostics from this hospitalization (including imaging, microbiology, ancillary and laboratory) are listed below for reference.    Significant Diagnostic Studies: CT ANGIO CHEST PE W OR WO CONTRAST  Result Date: 09/27/2019 CLINICAL DATA:  Left-sided chest pain and known left lower lobe lesion, history of recent inconclusive CTA of the chest EXAM: CT ANGIOGRAPHY CHEST WITH CONTRAST TECHNIQUE: Multidetector CT imaging of the chest  was performed using the standard protocol during bolus administration of intravenous contrast. Multiplanar CT image reconstructions and MIPs were obtained to evaluate the vascular anatomy. CONTRAST:  60mL OMNIPAQUE IOHEXOL 350 MG/ML SOLN COMPARISON:  CT from the previous day. FINDINGS: Cardiovascular: Thoracic aorta and its branches are within normal limits. No coronary calcifications are seen. No cardiac enlargement is noted. The pulmonary artery shows a normal branching pattern bilaterally. No definitive filling defects are identified to suggest pulmonary embolism. Mediastinum/Nodes: Thoracic inlet is within normal limits. No mediastinal adenopathy is noted. Better visualized on today's exam are multiple small left hilar nodes the largest of which measure approximately 9 mm. These are felt to be reactive in nature given the findings in the left lower lobe. The esophagus as visualized is within normal limits. Lungs/Pleura: Lungs are well aerated bilaterally. In the left lower lobe there is again noted a pleural based rounded density which measures approximately 6 cm in greatest dimension. Vessels are noted to course through this area of abnormality. Central decreased attenuation remains. This is slightly more prominent than that seen on the prior exam. A surrounding ground-glass opacity is noted similar to that seen on the prior exam.  No other focal infiltrate is noted. Upper Abdomen: Visualized upper abdomen is unremarkable. Musculoskeletal: No acute bony abnormality is noted. Review of the MIP images confirms the above findings. IMPRESSION: Changes in the left lower lobe most consistent with a round pneumonia given the appearance with surrounding inflammatory change and reactive lymphadenopathy. Short-term follow-up noncontrast CT is recommended in 3-4 weeks following appropriate antibiotic therapy. No evidence of pulmonary emboli. Electronically Signed   By: Alcide Clever M.D.   On: 09/27/2019 15:52   CT Angio  Chest PE W/Cm &/Or Wo Cm  Result Date: 09/26/2019 CLINICAL DATA:  Shortness of breath, concern for pulmonary infarction EXAM: CT ANGIOGRAPHY CHEST WITH CONTRAST TECHNIQUE: Multidetector CT imaging of the chest was performed using the standard protocol during bolus administration of intravenous contrast. Multiplanar CT image reconstructions and MIPs were obtained to evaluate the vascular anatomy. CONTRAST:  OMNIPAQUE IOHEXOL 350 MG/ML SOLN. Power injection was initially attempted, but aborted due to concern for contrast extravasation; approximately 75 mL of contrast was injected on initial attempt. Due to previous administration of 100 mL contrast for CT of the abdomen pelvis, and additional volume of contrast was not administered, and the remainder of approximately 25 mL of contrast was injected in a second attempt. CT examination of the chest was acquired, however contrast bolus was nondiagnostic as discussed below. COMPARISON:  Same day CT abdomen pelvis FINDINGS: Cardiovascular: Contrast bolus is nondiagnostic for pulmonary embolism, with preferential opacification of the thoracic aorta. Normal heart size. No pericardial effusion. Mediastinum/Nodes: No enlarged mediastinal, hilar, or axillary lymph nodes. Thyroid gland, trachea, and esophagus demonstrate no significant findings. Lungs/Pleura: There is a dense, rounded subpleural opacity in the dependent left lung base with a surrounding ground-glass halo, central portion measuring approximately 4.0 cm (series 6, image 50) Upper Abdomen: No acute abnormality. Musculoskeletal: No chest wall abnormality. No acute or significant osseous findings. Review of the MIP images confirms the above findings. IMPRESSION: 1. Contrast bolus is nondiagnostic for pulmonary embolism, primarily due to technical failure described above. Given the overall clinical concern for pulmonary embolus, technical repeat examination with administration of a third dose of contrast was  discussed with ordering provider Green, PA. 2. A dense, rounded subpleural opacity in the dependent left lung base is again seen, concerning for pulmonary infarction given appearance, although round pneumonia or aspiration could have this appearance. These results were called by telephone at the time of interpretation on 09/26/2019 at 2:51 pm to provider PA GARRETT GREEN , who verbally acknowledged these results. Electronically Signed   By: Lauralyn Primes M.D.   On: 09/26/2019 14:52   CT ABDOMEN PELVIS W CONTRAST  Result Date: 09/26/2019 CLINICAL DATA:  Left flank pain since yesterday EXAM: CT ABDOMEN AND PELVIS WITH CONTRAST TECHNIQUE: Multidetector CT imaging of the abdomen and pelvis was performed using the standard protocol following bolus administration of intravenous contrast. CONTRAST:  OMNIPAQUE IOHEXOL 300 MG/ML SOLN, additional oral enteric contrast COMPARISON:  None. FINDINGS: Lower chest: There is a partially imaged, rounded subpleural opacity of the dependent left lung base with a halo of ground-glass (series 4, image 1). Hepatobiliary: No solid liver abnormality is seen. Unchanged focal fatty deposition adjacent to the ligamentum venosum. No gallstones, gallbladder wall thickening, or biliary dilatation. Pancreas: Unremarkable. No pancreatic ductal dilatation or surrounding inflammatory changes. Spleen: Normal in size without significant abnormality. Adrenals/Urinary Tract: Adrenal glands are unremarkable. Kidneys are normal, without renal calculi, solid lesion, or hydronephrosis. Bladder is unremarkable. Stomach/Bowel: Stomach is within normal limits. Appendix  appears normal. No evidence of bowel wall thickening, distention, or inflammatory changes. Vascular/Lymphatic: No significant vascular findings are present. No enlarged abdominal or pelvic lymph nodes. Reproductive: Status post hysterectomy. Other: No abdominal wall hernia or abnormality. No abdominopelvic ascites. Musculoskeletal: No acute  or significant osseous findings. IMPRESSION: 1. No acute CT findings within the abdomen to explain left flank pain. No evidence of urinary tract calculus or hydronephrosis. 2. There is a partially imaged, rounded subpleural opacity of the dependent left lung base with a surrounding halo of ground-glass, this appearance highly suspicious for pulmonary infarction and generally referable to reported symptoms. Nonspecific differential considerations include infection or aspiration. There is no embolus identified in the lower chest, however only the subsegmental arteries of the lung bases are included on this examination of the abdomen and pelvis. Consider CT pulmonary angiogram to further evaluate. 3.  Aortic Atherosclerosis (ICD10-I70.0). Electronically Signed   By: Lauralyn Primes M.D.   On: 09/26/2019 12:01   ECHOCARDIOGRAM COMPLETE  Result Date: 09/27/2019    ECHOCARDIOGRAM REPORT   Patient Name:   Miranda Bernard Pilat Date of Exam: 09/27/2019 Medical Rec #:  [id number]         Height:       63.0 in Accession #:    [id number]        Weight:       125.0 lb Date of Birth:  [birth year]         BSA:          1.584 m Patient Age:    49 years          BP:           145/81 mmHg Patient Gender: F                 HR:           95 bpm. Exam Location:  Inpatient Procedure: 2D Echo, Cardiac Doppler and Color Doppler Indications:    Pulmonary Embolus 415.19 / I26.99  History:        Patient has prior history of Echocardiogram examinations, most                 recent 04/30/2014. Risk Factors:Hypertension, Dyslipidemia and                 Current Smoker. PE.  Sonographer:    Renella Cunas RDCS Referring Phys: [id number] SARA-MAIZ A THOMAS IMPRESSIONS  1. Left ventricular ejection fraction, by estimation, is 40 to 45%. The left ventricle has mildly decreased function. The left ventricle demonstrates global hypokinesis. Left ventricular diastolic parameters are indeterminate.  2. Right ventricular systolic function is normal. The right  ventricular size is normal. Tricuspid regurgitation signal is inadequate for assessing PA pressure.  3. The mitral valve is normal in structure. Trivial mitral valve regurgitation. No evidence of mitral stenosis.  4. The aortic valve is tricuspid. Aortic valve regurgitation is not visualized. No aortic stenosis is present.  5. The inferior vena cava is normal in size with greater than 50% respiratory variability, suggesting right atrial pressure of 3 mmHg. FINDINGS  Left Ventricle: Left ventricular ejection fraction, by estimation, is 40 to 45%. The left ventricle has mildly decreased function. The left ventricle demonstrates global hypokinesis. The left ventricular internal cavity size was normal in size. There is  no left ventricular hypertrophy. Left ventricular diastolic parameters are indeterminate. Right Ventricle: The right ventricular size is normal. No increase in right ventricular wall thickness. Right ventricular systolic function is normal.  Tricuspid regurgitation signal is inadequate for assessing PA pressure. Left Atrium: Left atrial size was normal in size. Right Atrium: Right atrial size was normal in size. Pericardium: Trivial pericardial effusion is present. Mitral Valve: The mitral valve is normal in structure. Trivial mitral valve regurgitation. No evidence of mitral valve stenosis. Tricuspid Valve: The tricuspid valve is normal in structure. Tricuspid valve regurgitation is trivial. No evidence of tricuspid stenosis. Aortic Valve: The aortic valve is tricuspid. Aortic valve regurgitation is not visualized. No aortic stenosis is present. Pulmonic Valve: The pulmonic valve was grossly normal. Pulmonic valve regurgitation is not visualized. No evidence of pulmonic stenosis. Aorta: The aortic root and ascending aorta are structurally normal, with no evidence of dilitation. Venous: The inferior vena cava is normal in size with greater than 50% respiratory variability, suggesting right atrial pressure  of 3 mmHg. IAS/Shunts: No atrial level shunt detected by color flow Doppler.  LEFT VENTRICLE PLAX 2D LVIDd:         4.50 cm      Diastology LVIDs:         4.10 cm      LV e' lateral:   5.66 cm/s LV PW:         0.90 cm      LV E/e' lateral: 12.8 LV IVS:        0.70 cm      LV e' medial:    5.22 cm/s LVOT diam:     1.90 cm      LV E/e' medial:  13.8 LV SV:         39 LV SV Index:   25 LVOT Area:     2.84 cm  LV Volumes (MOD) LV vol d, MOD A2C: 109.0 ml LV vol d, MOD A4C: 94.6 ml LV vol s, MOD A2C: 60.6 ml LV vol s, MOD A4C: 54.1 ml LV SV MOD A2C:     48.4 ml LV SV MOD A4C:     94.6 ml LV SV MOD BP:      44.3 ml RIGHT VENTRICLE RV S prime:     12.00 cm/s TAPSE (M-mode): 1.8 cm LEFT ATRIUM             Index       RIGHT ATRIUM          Index LA diam:        2.50 cm 1.58 cm/m  RA Area:     9.22 cm LA Vol (A2C):   21.4 ml 13.51 ml/m RA Volume:   18.40 ml 11.62 ml/m LA Vol (A4C):   16.5 ml 10.42 ml/m LA Biplane Vol: 19.3 ml 12.19 ml/m  AORTIC VALVE LVOT Vmax:   91.40 cm/s LVOT Vmean:  59.600 cm/s LVOT VTI:    0.137 m  AORTA Ao Root diam: 2.90 cm MITRAL VALVE MV Area (PHT): 3.34 cm     SHUNTS MV Decel Time: 227 msec     Systemic VTI:  0.14 m MV E velocity: 72.20 cm/s   Systemic Diam: 1.90 cm MV A velocity: 108.00 cm/s MV E/A ratio:  0.67 Jodelle Red MD Electronically signed by Jodelle Red MD Signature Date/Time: 09/27/2019/2:28:21 PM    Final    VAS [place] LOWER EXTREMITY VENOUS (DVT)  Result Date: 09/27/2019  Lower Venous DVTStudy Indications: Pain.  Risk Factors: None identified. Comparison Study: No prior studies. Performing Technologist: Chanda Busing RVT  Examination Guidelines: A complete evaluation includes B-mode imaging, spectral Doppler, color Doppler, and power Doppler as needed of  all accessible portions of each vessel. Bilateral testing is considered an integral part of a complete examination. Limited examinations for reoccurring indications may be performed as noted. The reflux  portion of the exam is performed with the patient in reverse Trendelenburg.  +-----+---------------+---------+-----------+----------+--------------+ RIGHTCompressibilityPhasicitySpontaneityPropertiesThrombus Aging +-----+---------------+---------+-----------+----------+--------------+ CFV  Full           Yes      Yes                                 +-----+---------------+---------+-----------+----------+--------------+   +---------+---------------+---------+-----------+----------+--------------+ LEFT     CompressibilityPhasicitySpontaneityPropertiesThrombus Aging +---------+---------------+---------+-----------+----------+--------------+ CFV      Full           Yes      Yes                                 +---------+---------------+---------+-----------+----------+--------------+ SFJ      Full                                                        +---------+---------------+---------+-----------+----------+--------------+ FV Prox  Full                                                        +---------+---------------+---------+-----------+----------+--------------+ FV Mid   Full                                                        +---------+---------------+---------+-----------+----------+--------------+ FV DistalFull                                                        +---------+---------------+---------+-----------+----------+--------------+ PFV      Full                                                        +---------+---------------+---------+-----------+----------+--------------+ POP      Full           Yes      Yes                                 +---------+---------------+---------+-----------+----------+--------------+ PTV      Full                                                        +---------+---------------+---------+-----------+----------+--------------+ PERO     Full                                                         +---------+---------------+---------+-----------+----------+--------------+  Summary: RIGHT: - No evidence of common femoral vein obstruction.  LEFT: - There is no evidence of deep vein thrombosis in the lower extremity.  - No cystic structure found in the popliteal fossa.  *See table(s) above for measurements and observations. Electronically signed by Sherald Hess MD on 09/27/2019 at 3:58:33 PM.    Final     Microbiology: Recent Results (from the past 240 hour(s))  Culture, Urine     Status: Abnormal   Collection Time: 09/26/19 11:31 AM   Specimen: Urine, Clean Catch  Result Value Ref Range Status   Specimen Description   Final    URINE, CLEAN CATCH Performed at [facility], [address] [place], [place] [address]    Special Requests   Final    NONE Performed at [facility], [address] [place], [place] [address]    Culture >=100,000 COLONIES/mL ESCHERICHIA COLI (A)  Final   Report Status 09/28/2019 FINAL  Final   Organism ID, Bacteria ESCHERICHIA COLI (A)  Final      Susceptibility   Escherichia coli - MIC*    AMPICILLIN 4 SENSITIVE Sensitive     CEFAZOLIN <=4 SENSITIVE Sensitive     CEFTRIAXONE <=1 SENSITIVE Sensitive     CIPROFLOXACIN <=0.25 SENSITIVE Sensitive     GENTAMICIN <=1 SENSITIVE Sensitive     IMIPENEM <=0.25 SENSITIVE Sensitive     NITROFURANTOIN <=16 SENSITIVE Sensitive     TRIMETH/SULFA <=20 SENSITIVE Sensitive     AMPICILLIN/SULBACTAM <=2 SENSITIVE Sensitive     PIP/TAZO <=4 SENSITIVE Sensitive     * >=100,000 COLONIES/mL ESCHERICHIA COLI  SARS Coronavirus 2 by RT PCR (hospital order, performed in [facility] hospital lab) Nasopharyngeal Nasopharyngeal Swab     Status: None   Collection Time: 09/26/19  3:51 PM   Specimen: Nasopharyngeal Swab  Result Value Ref Range Status   SARS Coronavirus 2 NEGATIVE NEGATIVE Final    Comment: (NOTE) SARS-CoV-2 target nucleic acids are NOT DETECTED. The SARS-CoV-2 RNA is  generally detectable in upper and lower respiratory specimens during the acute phase of infection. The lowest concentration of SARS-CoV-2 viral copies this assay can detect is 250 copies / mL. A negative result does not preclude SARS-CoV-2 infection and should not be used as the sole basis for treatment or other patient management decisions.  A negative result may occur with improper specimen collection / handling, submission of specimen other than nasopharyngeal swab, presence of viral mutation(s) within the areas targeted by this assay, and inadequate number of viral copies (<250 copies / mL). A negative result must be combined with clinical observations, patient history, and epidemiological information. Fact Sheet for Patients:   BoilerBrush.com.cy Fact Sheet for Healthcare Providers: https://pope.com/ This test is not yet approved or cleared  by the [place] FDA and has been authorized for detection and/or diagnosis of SARS-CoV-2 by FDA under an Emergency Use Authorization (EUA).  This EUA will remain in effect (meaning this test can be used) for the duration of the COVID-19 declaration under Section 564(b)(1) of the Act, 21 U.S.C. section 360bbb-3(b)(1), unless the authorization is terminated or revoked sooner. Performed at [facility], [address] [place], [place] [address]   Culture, blood (routine x 2)     Status: None (Preliminary result)   Collection Time: 09/26/19 10:24 PM   Specimen: BLOOD  Result Value Ref Range Status   Specimen Description BLOOD LEFT ANTECUBITAL  Final   Special Requests  Final    BOTTLES DRAWN AEROBIC AND ANAEROBIC Blood Culture adequate volume   Culture   Final    NO GROWTH 3 DAYS Performed at [facility], [address] [place], [place] [address]    Report Status PENDING  Incomplete  Culture, blood (routine x 2)     Status: None (Preliminary result)   Collection Time:  09/26/19 10:30 PM   Specimen: BLOOD LEFT WRIST  Result Value Ref Range Status   Specimen Description BLOOD LEFT WRIST  Final   Special Requests   Final    BOTTLES DRAWN AEROBIC AND ANAEROBIC Blood Culture adequate volume   Culture   Final    NO GROWTH 3 DAYS Performed at [facility], [address] [place], [place] [address]    Report Status PENDING  Incomplete     Labs: Basic Metabolic Panel: Recent Labs  Lab 09/26/19 1021 09/27/19 0402 09/28/19 0412 09/29/19 0453  NA 134* 135 132* 128*  K 3.7 4.2 3.3* 3.7  CL 100 101 98 97*  CO2 22 23 23 24   GLUCOSE 120* 110* 90 92  BUN 8 6 7 6   CREATININE 0.71 0.74 0.79 0.75  CALCIUM 9.1 8.7* 8.4* 8.3*   Liver Function Tests: Recent Labs  Lab 09/26/19 1021 09/27/19 0402  AST 19 21  ALT 14 13  ALKPHOS 71 61  BILITOT 2.0* 2.3*  PROT 7.5 6.4*  ALBUMIN 4.3 3.4*   No results for input(s): LIPASE, AMYLASE in the last 168 hours. No results for input(s): AMMONIA in the last 168 hours. CBC: Recent Labs  Lab 09/26/19 1021 09/27/19 0402 09/28/19 0412 09/29/19 0453  WBC 11.6* 10.9* 7.6 6.0  NEUTROABS 9.2*  --   --   --   HGB 15.6* 14.7 13.7 12.4  HCT 43.9 41.7 37.8 35.0*  MCV 116.1* 118.1* 114.5* 115.1*  PLT 227 178 170 165   Cardiac Enzymes: Recent Labs  Lab 09/26/19 2038  CKTOTAL 60   BNP: BNP (last 3 results) Recent Labs    09/26/19 2039  BNP 81.8    ProBNP (last 3 results) No results for input(s): PROBNP in the last 8760 hours.  CBG: Recent Labs  Lab 09/26/19 2057 09/27/19 0747  GLUCAP 120* 189*       Signed:  11/26/19 MD.  [facility] 09/29/2019, 9:33 AM

## 2019-09-29 NOTE — Plan of Care (Signed)

## 2019-10-01 LAB — CULTURE, BLOOD (ROUTINE X 2)
Culture: NO GROWTH
Culture: NO GROWTH
Special Requests: ADEQUATE
Special Requests: ADEQUATE

## 2022-02-23 IMAGING — CT CT ANGIO CHEST
2 of 6 series · 18 of 36 positions shown · IV contrast (omnipaque)
Comparison: CT from the previous day.

CLINICAL DATA: Left-sided chest pain and known left lower lobe
lesion, history of recent inconclusive CTA of the chest

EXAM:
CT ANGIOGRAPHY CHEST WITH CONTRAST
TECHNIQUE: Multidetector CT imaging of the chest was performed using the
standard protocol during bolus administration of intravenous
contrast. Multiplanar CT image reconstructions and MIPs were
obtained to evaluate the vascular anatomy.
CONTRAST:  60mL OMNIPAQUE IOHEXOL 350 MG/ML SOLN

[Series 11: pe thins · axial · 0.67mm/px · z∈[+1108,+1328]mm · 17 of 351 slices shown]
[im 18/351  lung]
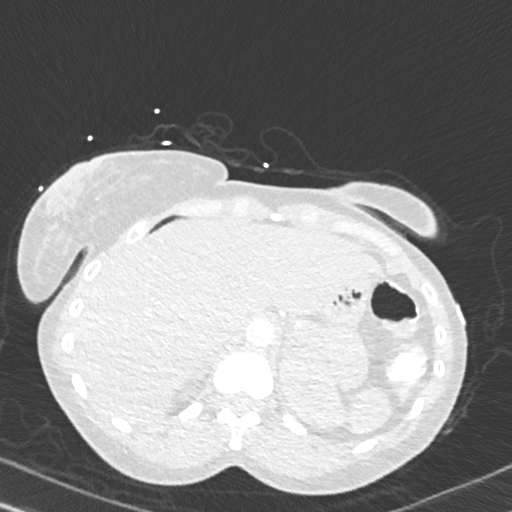
[im 36/351  mediastinal]
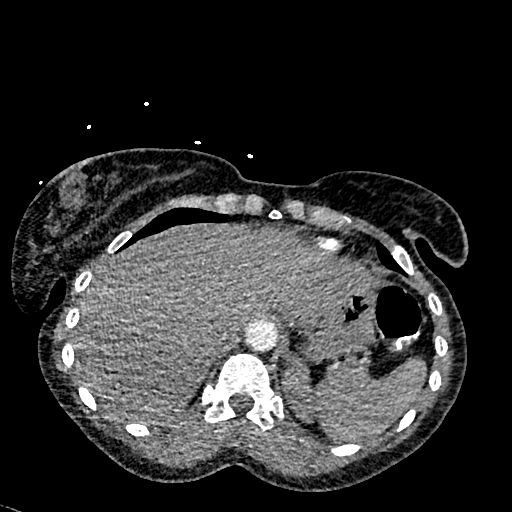
[im 53/351  lung]
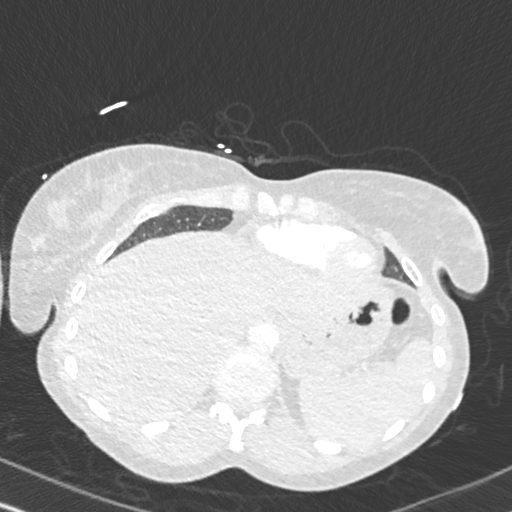
[im 71/351  mediastinal]
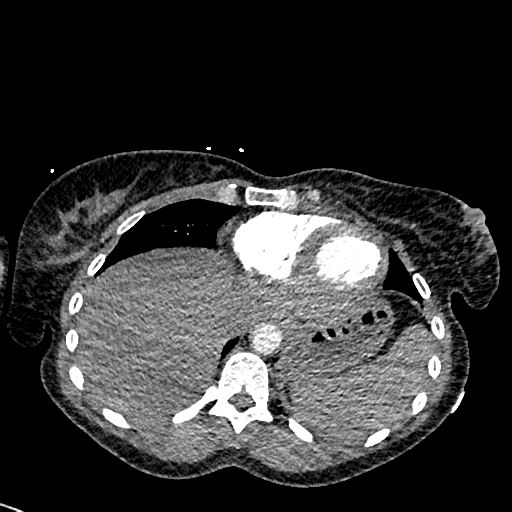
[im 106/351  lung]
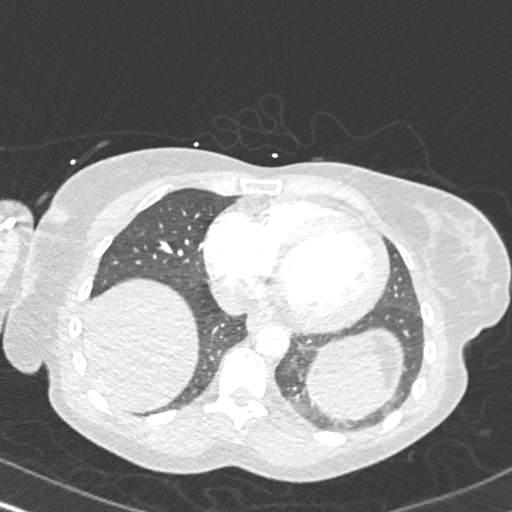
[im 123/351  mediastinal]
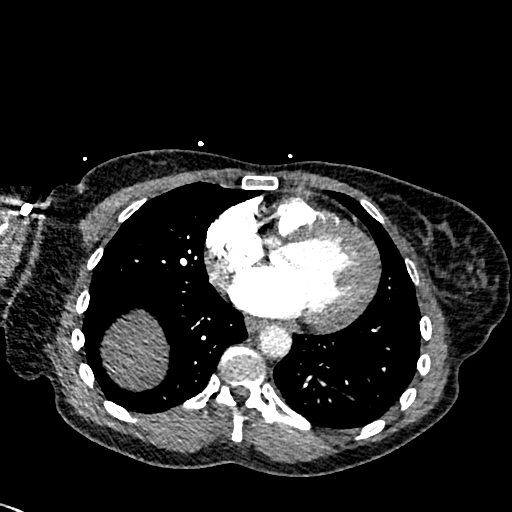
[im 141/351  lung]
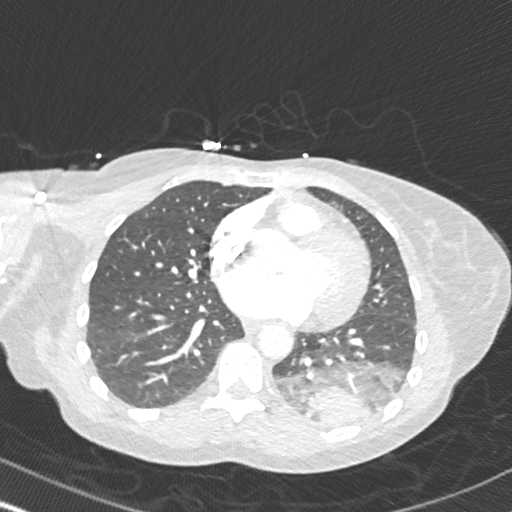
[im 158/351  mediastinal]
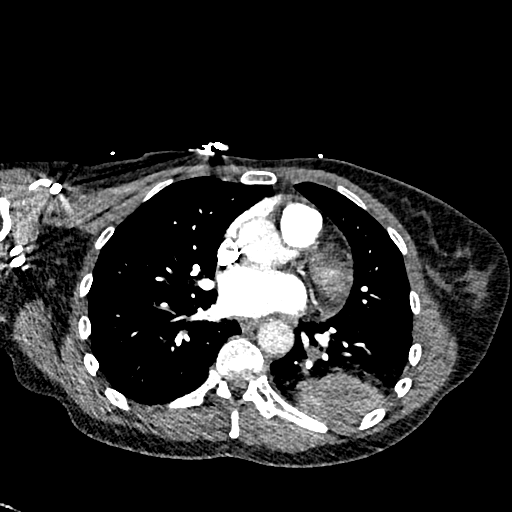
[im 176/351  lung]
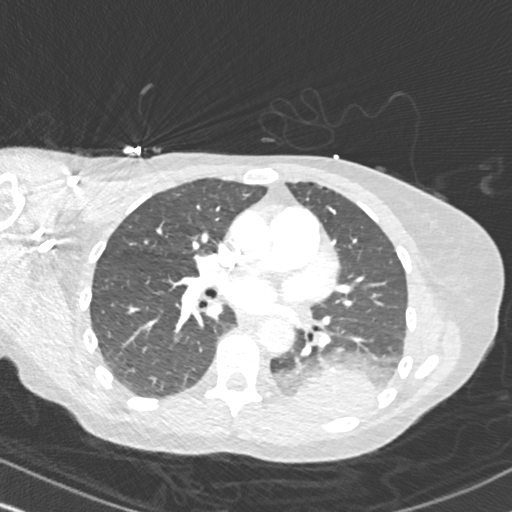
[im 193/351  mediastinal]
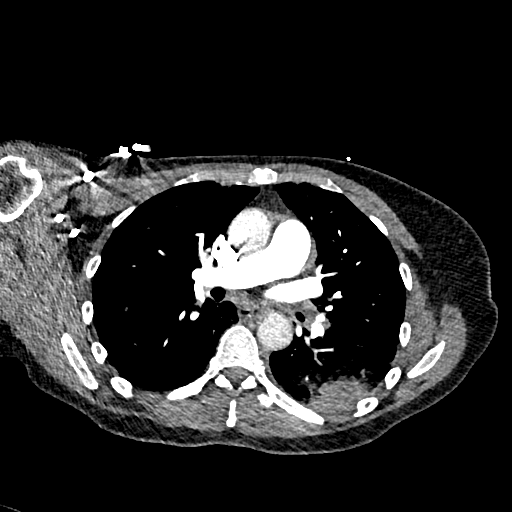
[im 211/351  lung]
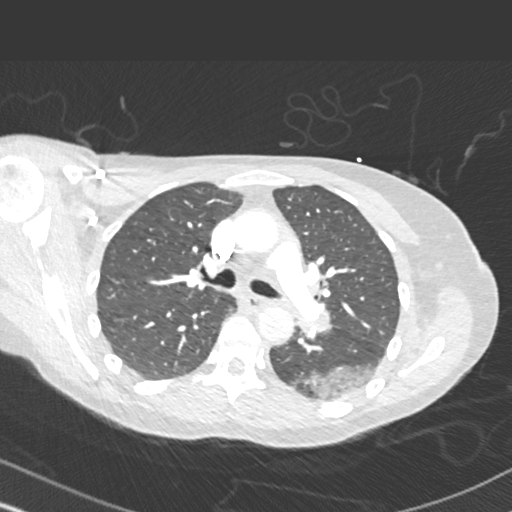
[im 228/351  mediastinal]
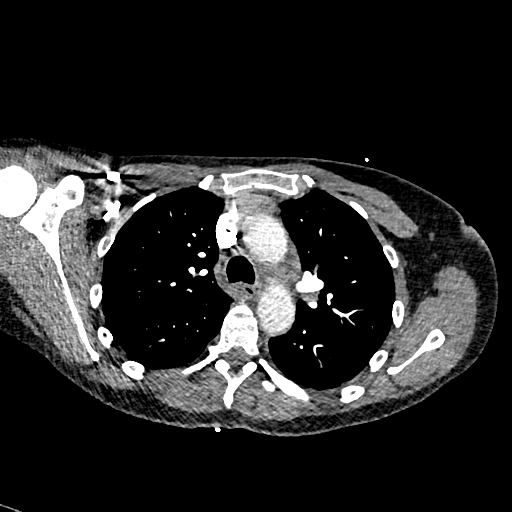
[im 246/351  lung]
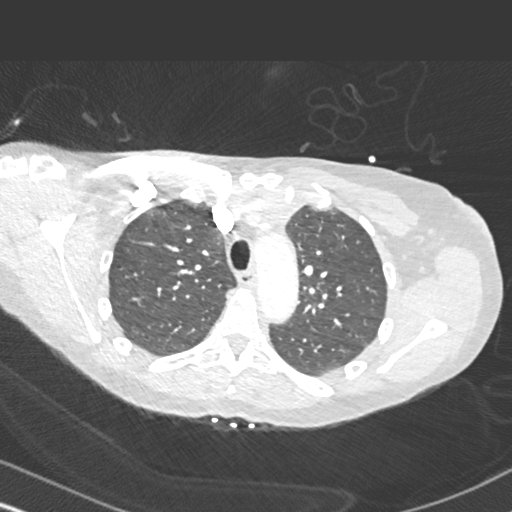
[im 281/351  mediastinal]
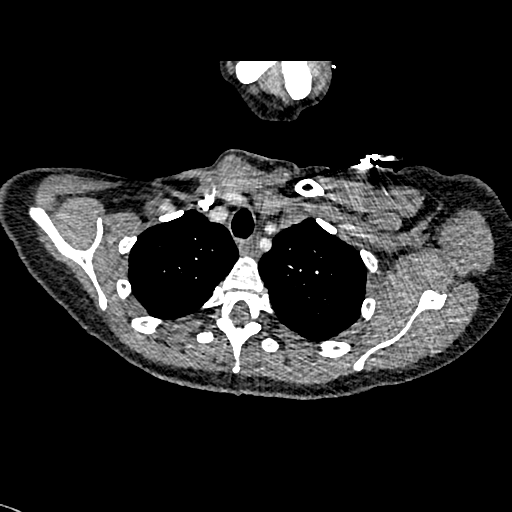
[im 298/351  lung]
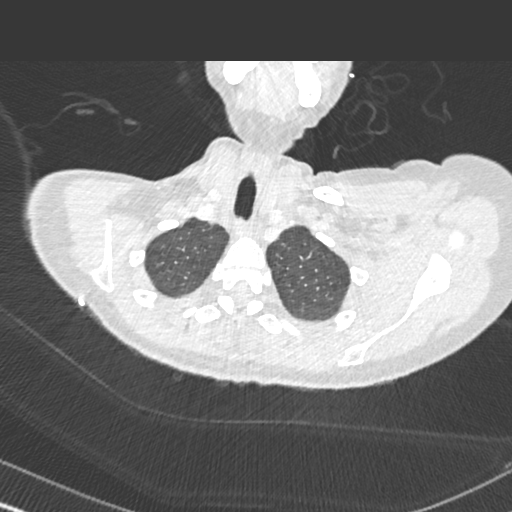
[im 316/351  mediastinal]
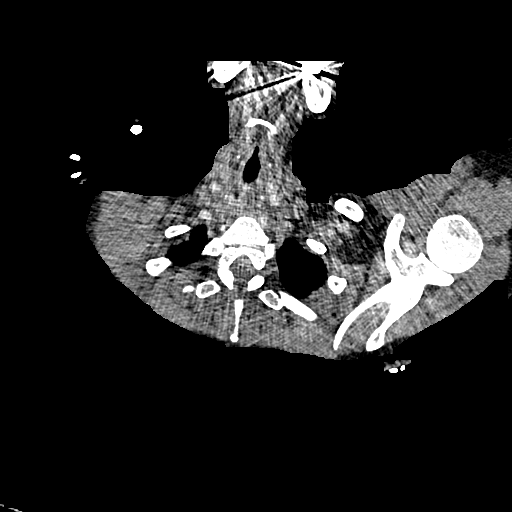
[im 333/351  lung]
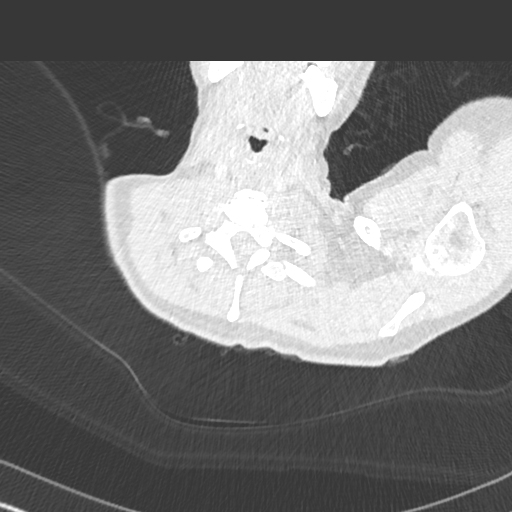

[Series 12: pe 2mm cor · coronal · 0.50mm/px · 1 of 151 slices shown]
[im 76/151  mediastinal]
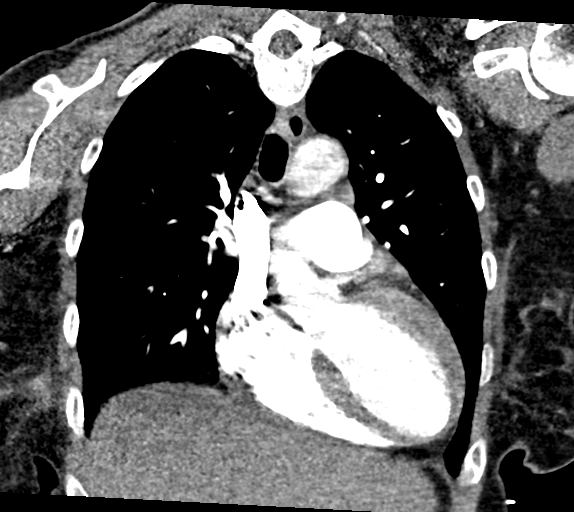

[18 of 36 positions shown; findings below may reference images not displayed]

FINDINGS: Cardiovascular: Thoracic aorta and its branches are within normal
limits. No coronary calcifications are seen. No cardiac enlargement
is noted. The pulmonary artery shows a normal branching pattern
bilaterally. No definitive filling defects are identified to suggest
pulmonary embolism.

Mediastinum/Nodes: Thoracic inlet is within normal limits. No
mediastinal adenopathy is noted. Better visualized on today's exam
are multiple small left hilar nodes the largest of which measure
approximately 9 mm. These are felt to be reactive in nature given
the findings in the left lower lobe. The esophagus as visualized is
within normal limits.

Lungs/Pleura: Lungs are well aerated bilaterally. In the left lower
lobe there is again noted a pleural based rounded density which
measures approximately 6 cm in greatest dimension. Vessels are noted
to course through this area of abnormality. Central decreased
attenuation remains. This is slightly more prominent than that seen
on the prior exam. A surrounding ground-glass opacity is noted
similar to that seen on the prior exam. No other focal infiltrate is
noted.

Upper Abdomen: Visualized upper abdomen is unremarkable.

Musculoskeletal: No acute bony abnormality is noted.

Review of the MIP images confirms the above findings.
IMPRESSION: Changes in the left lower lobe most consistent with a round
pneumonia given the appearance with surrounding inflammatory change
and reactive lymphadenopathy. Short-term follow-up noncontrast CT is
recommended in 3-4 weeks following appropriate antibiotic therapy.

No evidence of pulmonary emboli.
